# Patient Record
Sex: Male | Born: 1955 | Race: Black or African American | Hispanic: No | Marital: Married | Smoking: Former smoker
Health system: Southern US, Community
[De-identification: ages and names within clinical notes are randomized; demographics above are authoritative.]

## PROBLEM LIST (undated history)

## (undated) DIAGNOSIS — I10 Essential (primary) hypertension: Secondary | ICD-10-CM

## (undated) DIAGNOSIS — E7800 Pure hypercholesterolemia, unspecified: Secondary | ICD-10-CM

## (undated) DIAGNOSIS — K219 Gastro-esophageal reflux disease without esophagitis: Secondary | ICD-10-CM

## (undated) DIAGNOSIS — H269 Unspecified cataract: Secondary | ICD-10-CM

## (undated) DIAGNOSIS — M722 Plantar fascial fibromatosis: Secondary | ICD-10-CM

## (undated) DIAGNOSIS — M1990 Unspecified osteoarthritis, unspecified site: Secondary | ICD-10-CM

## (undated) DIAGNOSIS — R0989 Other specified symptoms and signs involving the circulatory and respiratory systems: Secondary | ICD-10-CM

## (undated) DIAGNOSIS — D649 Anemia, unspecified: Secondary | ICD-10-CM

## (undated) HISTORY — PX: OTHER SURGICAL HISTORY: SHX169

---

## 2007-08-02 ENCOUNTER — Encounter: Admission: RE | Admit: 2007-08-02 | Discharge: 2007-08-14 | Payer: Self-pay | Admitting: Orthopedic Surgery

## 2007-08-15 HISTORY — PX: OTHER SURGICAL HISTORY: SHX169

## 2007-08-19 ENCOUNTER — Encounter: Admission: RE | Admit: 2007-08-19 | Discharge: 2007-11-04 | Payer: Self-pay | Admitting: Orthopedic Surgery

## 2007-09-09 ENCOUNTER — Encounter: Admission: RE | Admit: 2007-09-09 | Discharge: 2007-11-04 | Payer: Self-pay | Admitting: Orthopedic Surgery

## 2007-11-07 ENCOUNTER — Encounter: Admission: RE | Admit: 2007-11-07 | Discharge: 2007-11-07 | Payer: Self-pay | Admitting: Orthopedic Surgery

## 2007-11-11 ENCOUNTER — Encounter: Admission: RE | Admit: 2007-11-11 | Discharge: 2007-11-11 | Payer: Self-pay | Admitting: Orthopedic Surgery

## 2008-01-02 ENCOUNTER — Ambulatory Visit (HOSPITAL_COMMUNITY): Admission: RE | Admit: 2008-01-02 | Discharge: 2008-01-04 | Payer: Self-pay | Admitting: Orthopedic Surgery

## 2008-02-10 ENCOUNTER — Encounter: Admission: RE | Admit: 2008-02-10 | Discharge: 2008-05-10 | Payer: Self-pay | Admitting: Orthopedic Surgery

## 2009-08-26 ENCOUNTER — Ambulatory Visit (HOSPITAL_COMMUNITY): Admission: RE | Admit: 2009-08-26 | Discharge: 2009-08-26 | Payer: Self-pay | Admitting: Ophthalmology

## 2010-10-30 LAB — CBC
HCT: 43.1 % (ref 39.0–52.0)
Platelets: 277 10*3/uL (ref 150–400)
RDW: 15.1 % (ref 11.5–15.5)

## 2010-12-27 NOTE — Op Note (Signed)
NAME:  Micheal Lucas, Micheal Lucas NO.:  [id number]   MEDICAL RECORD NO.:  [id number]          PATIENT TYPE:  OIB   LOCATION:  5028                         FACILITY:  [facility]   PHYSICIAN:  [name], [name] DATE OF BIRTH:  [birth year]   DATE OF PROCEDURE:  01/02/2008  DATE OF DISCHARGE:                               OPERATIVE REPORT   PREOPERATIVE DIAGNOSES:  Left the genu varum, status post Carticel  cartilage transplantation.   POSTOPERATIVE DIAGNOSES:  Left the genu varum, status post Carticel  cartilage transplantation.   PROCEDURE PERFORMED:  High tibial osteotomy using Arthrex opening wedge  system.   ATTENDING SURGEON:  [name], MD   ASSISTANT:  [name], MD.   ANESTHESIA:  General anesthesia was used plus femoral block.   ESTIMATED BLOOD LOSS:  Minimal.   FLUID REPLACEMENT:  2000 mL crystalloid.   TOURNIQUET TIME:  1 hour 45 minutes at 300 mmHg.   INSTRUMENT COUNTS:  Correct.   COMPLICATIONS:  There were no complications.   ANTIBIOTICS:  Perioperative antibiotics were given.   INDICATIONS:  The patient is a 55 year old male with a history of a  cartilage injury to his left knee that he sustained on the job.  The  patient underwent multiple knee surgeries, the latest, which was a  autologous chondrocyte implantation or Carticel procedure.  The patient  has a significant varus alignment to the knee, which will over time lead  over stress of that cartilage transplantation.  After counseling the  patient regarding the benefits of high tibial osteotomy and unloading  the weight off the medial compartment, the patient elected to proceed  with surgical management.  Informed consent was obtained.   DESCRIPTION OF PROCEDURE:  After an adequate level of anesthesia was  achieved the patient was positioned supine on the operating table.  Nonsterile tourniquets placed on left proximal thigh.  Left leg  sterilely prepped and draped usual manner.   A hockey-stick type incision  was created starting at the medial tibial crest, below the tibial  tubercle, extending up to the tibial tubercle and proceeding medially.  Dissection was carried down through subcutaneous tissues using Bovie  electrocautery.  A full-thickness flap was lifted off the tibia down to  subperiosteal level.  At this point, a guide pin was placed about a  centimeter and half below the joint line, mid tibia, and perpendicular  little long axis of the tibia.  A guide was then inserted over the top  of the guide pin and two parallel K-wires were placed in oblique manner  angling to the appropriate point on the lateral side of the tibia.  This  was about a centimeter medial to the lateral cortex of the tibia.  Once  these pins were confirmed in their location on C-arm in multiple planes  including matching the posterior tibial slope the patient already had,  we went ahead and removed the proximal guide pin and then placed a saw  capture guide over the K-wires and then tapped a set pin in place to  hold that saw capture in place, so we then made her  osteotomy.  We had  dissected posteriorly and anteriorly and used appropriate retractors to  protect the soft tissue.  We then made her osteotomy with the  oscillating saw and then finished it with the osteotome.  We were  careful not to breech the lateral cortex.  We then opened the tibia.  Using the fluoroscope marking the center of the hip and the center of  the ankle, we corrected the mechanical axis to the midportion of the  lateral compartment of the knee.  Once we did this, we measured the size  of opening.  This was a 15-degree opening wedge and we selected the  appropriate opening wedge plate and applied that to the tibia.  At this  point, we went ahead and bone grafted using allograft portions of bone  that had been precut by the Arthrex system.  These have been soaking on  the back table and saline for about an  hour.  We fashioned these to the  appropriate size, inserted them into the appropriate defects, fully  filling the defect in the tibia, the plate was secured with 6.5  cancellous screws proximally and 4.5 bicortical screws distally.  Final  radiographs demonstrated appropriate correction of the patient's varus  deformity.  We thoroughly irrigated and closed with layered closure with  zero 2-0 Vicryl followed by staples for skin.  Sterile compressive  dressing and knee immobilizer were applied.  The patient tolerated the  surgery well.      [name], [name]  Electronically Signed     SRN/MEDQ  D:  01/03/2008  T:  01/03/2008  Job:  [id number]

## 2013-08-14 HISTORY — PX: EYE SURGERY: SHX253

## 2016-04-10 HISTORY — PX: EYE SURGERY: SHX253

## 2016-04-18 NOTE — Progress Notes (Signed)
Patient is being scheduled for preop appt. Please place surgical orders in epic. Thanks. 

## 2016-05-09 ENCOUNTER — Encounter (HOSPITAL_COMMUNITY)
Admission: RE | Admit: 2016-05-09 | Discharge: 2016-05-09 | Disposition: A | Discharge status: Home / Self Care | Payer: BLUE CROSS/BLUE SHIELD | Source: Ambulatory Visit | Attending: Orthopedic Surgery | Admitting: Orthopedic Surgery

## 2016-05-09 ENCOUNTER — Encounter (HOSPITAL_COMMUNITY): Payer: Self-pay

## 2016-05-09 DIAGNOSIS — Z01818 Encounter for other preprocedural examination: Secondary | ICD-10-CM | POA: Insufficient documentation

## 2016-05-09 HISTORY — DX: Gastro-esophageal reflux disease without esophagitis: K21.9

## 2016-05-09 HISTORY — DX: Essential (primary) hypertension: I10

## 2016-05-09 HISTORY — DX: Anemia, unspecified: D64.9

## 2016-05-09 HISTORY — DX: Pure hypercholesterolemia, unspecified: E78.00

## 2016-05-09 HISTORY — DX: Unspecified osteoarthritis, unspecified site: M19.90

## 2016-05-09 HISTORY — DX: Other specified symptoms and signs involving the circulatory and respiratory systems: R09.89

## 2016-05-09 HISTORY — DX: Plantar fascial fibromatosis: M72.2

## 2016-05-09 HISTORY — DX: Unspecified cataract: H26.9

## 2016-05-09 LAB — CBC
HCT: 40 % (ref 39.0–52.0)
HEMOGLOBIN: 12.9 g/dL — AB (ref 13.0–17.0)
MCH: 22.2 pg — AB (ref 26.0–34.0)
MCHC: 32.3 g/dL (ref 30.0–36.0)
MCV: 68.7 fL — ABNORMAL LOW (ref 78.0–100.0)
PLATELETS: 218 10*3/uL (ref 150–400)
RBC: 5.82 MIL/uL — ABNORMAL HIGH (ref 4.22–5.81)
RDW: 15.7 % — ABNORMAL HIGH (ref 11.5–15.5)
WBC: 8.3 10*3/uL (ref 4.0–10.5)

## 2016-05-09 LAB — BASIC METABOLIC PANEL
Anion gap: 5 (ref 5–15)
BUN: 10 mg/dL (ref 6–20)
CHLORIDE: 105 mmol/L (ref 101–111)
CO2: 29 mmol/L (ref 22–32)
CREATININE: 0.94 mg/dL (ref 0.61–1.24)
Calcium: 9.3 mg/dL (ref 8.9–10.3)
GFR calc Af Amer: >60 mL/min (ref >60)
GFR calc non Af Amer: >60 mL/min (ref >60)
Glucose, Bld: 88 mg/dL (ref 65–99)
Potassium: 4.3 mmol/L (ref 3.5–5.1)
SODIUM: 139 mmol/L (ref 135–145)

## 2016-05-09 LAB — SURGICAL PCR SCREEN
MRSA, PCR: NEGATIVE
Staphylococcus aureus: NEGATIVE

## 2016-05-09 NOTE — Patient Instructions (Signed)
Micheal PerchWilliam Mcwhirt  05/09/2016   Your procedure is scheduled on: 05-23-16  [facility]  [facility] 3[facility] floor to  Short Stay Center at 515  AM.  Call this number if you have problems the morning of surgery [phone]   Remember: ONLY 1 PERSON MAY GO WITH YOU TO SHORT STAY TO GET  READY MORNING OF YOUR SURGERY.  Do not eat food or drink liquids :After Midnight.     Take these medicines the morning of surgery with A SIP OF WATER: CARVEDILOL, EYEDROP, OMEPRAZOLE                               You may not have any metal on your body including hair pins and              piercings  Do not wear jewelry, make-up, lotions, powders or perfumes, deodorant             Do not wear nail polish.  Do not shave  48 hours prior to surgery.              Men may shave face and neck.   Do not bring valuables to the hospital. [facility] IS NOT             RESPONSIBLE   FOR VALUABLES.  Contacts, dentures or bridgework may not be worn into surgery.  Leave suitcase in the car. After surgery it may be brought to your room.                  Please read over the following fact sheets you were given: _____________________________________________________________________             [facility] - Preparing for Surgery Before surgery, you can play an important role.  Because skin is not sterile, your skin needs to be as free of germs as possible.  You can reduce the number of germs on your skin by washing with CHG (chlorahexidine gluconate) soap before surgery.  CHG is an antiseptic cleaner which kills germs and bonds with the skin to continue killing germs even after washing. Please DO NOT use if you have an allergy to CHG or antibacterial soaps.  If your skin becomes reddened/irritated stop using the CHG and inform your nurse when you arrive at Short Stay. Do not shave (including legs and underarms) for at least 48 hours prior to the first CHG  shower.  You may shave your face/neck. Please follow these instructions carefully:  1.  Shower with CHG Soap the night before surgery and the  morning of Surgery.  2.  If you choose to wash your hair, wash your hair first as usual with your  normal  shampoo.  3.  After you shampoo, rinse your hair and body thoroughly to remove the  shampoo.                           4.  Use CHG as you would any other liquid soap.  You can apply chg directly  to the skin and wash                       Gently with a scrungie or clean washcloth.  5.  Apply the CHG  Soap to your body ONLY FROM THE NECK DOWN.   Do not use on face/ open                           Wound or open sores. Avoid contact with eyes, ears mouth and genitals (private parts).                       Wash face,  Genitals (private parts) with your normal soap.             6.  Wash thoroughly, paying special attention to the area where your surgery  will be performed.  7.  Thoroughly rinse your body with warm water from the neck down.  8.  DO NOT shower/wash with your normal soap after using and rinsing off  the CHG Soap.                9.  Pat yourself dry with a clean towel.            10.  Wear clean pajamas.            11.  Place clean sheets on your bed the night of your first shower and do not  sleep with pets. Day of Surgery : Do not apply any lotions/deodorants the morning of surgery.  Please wear clean clothes to the hospital/surgery center.  FAILURE TO FOLLOW THESE INSTRUCTIONS MAY RESULT IN THE CANCELLATION OF YOUR SURGERY PATIENT SIGNATURE_________________________________  NURSE SIGNATURE__________________________________  ________________________________________________________________________   Rogelia MireIncentive Spirometer  An incentive spirometer is a tool that can help keep your lungs clear and active. This tool measures how well you are filling your lungs with each breath. Taking long deep breaths may help reverse or decrease the chance  of developing breathing (pulmonary) problems (especially infection) following:  A long period of time when you are unable to move or be active. BEFORE THE PROCEDURE   If the spirometer includes an indicator to show your best effort, your nurse or respiratory therapist will set it to a desired goal.  If possible, sit up straight or lean slightly forward. Try not to slouch.  Hold the incentive spirometer in an upright position. INSTRUCTIONS FOR USE  1. Sit on the edge of your bed if possible, or sit up as far as you can in bed or on a chair. 2. Hold the incentive spirometer in an upright position. 3. Breathe out normally. 4. Place the mouthpiece in your mouth and seal your lips tightly around it. 5. Breathe in slowly and as deeply as possible, raising the piston or the ball toward the top of the column. 6. Hold your breath for 3-5 seconds or for as long as possible. Allow the piston or ball to fall to the bottom of the column. 7. Remove the mouthpiece from your mouth and breathe out normally. 8. Rest for a few seconds and repeat Steps 1 through 7 at least 10 times every 1-2 hours when you are awake. Take your time and take a few normal breaths between deep breaths. 9. The spirometer may include an indicator to show your best effort. Use the indicator as a goal to work toward during each repetition. 10. After each set of 10 deep breaths, practice coughing to be sure your lungs are clear. If you have an incision (the cut made at the time of surgery), support your incision when coughing by placing a pillow or rolled up towels  firmly against it. Once you are able to get out of bed, walk around indoors and cough well. You may stop using the incentive spirometer when instructed by your caregiver.  RISKS AND COMPLICATIONS  Take your time so you do not get dizzy or light-headed.  If you are in pain, you may need to take or ask for pain medication before doing incentive spirometry. It is harder to  take a deep breath if you are having pain. AFTER USE  Rest and breathe slowly and easily.  It can be helpful to keep track of a log of your progress. Your caregiver can provide you with a simple table to help with this. If you are using the spirometer at home, follow these instructions: SEEK MEDICAL CARE IF:   You are having difficultly using the spirometer.  You have trouble using the spirometer as often as instructed.  Your pain medication is not giving enough relief while using the spirometer.  You develop fever of 100.5 F (38.1 C) or higher. SEEK IMMEDIATE MEDICAL CARE IF:   You cough up bloody sputum that had not been present before.  You develop fever of 102 F (38.9 C) or greater.  You develop worsening pain at or near the incision site. MAKE SURE YOU:   Understand these instructions.  Will watch your condition.  Will get help right away if you are not doing well or get worse. Document Released: 12/11/2006 Document Revised: 10/23/2011 Document Reviewed: 02/11/2007 Exit[employer] Patient Information 2014 ExitCare, [place].   ________________________________________________________________________  WHAT IS A BLOOD TRANSFUSION? Blood Transfusion Information  A transfusion is the replacement of blood or some of its parts. Blood is made up of multiple cells which provide different functions.  Red blood cells carry oxygen and are used for blood loss replacement.  White blood cells fight against infection.  Platelets control bleeding.  Plasma helps clot blood.  Other blood products are available for specialized needs, such as hemophilia or other clotting disorders. BEFORE THE TRANSFUSION  Who gives blood for transfusions?   Healthy volunteers who are fully evaluated to make sure their blood is safe. This is blood bank blood. Transfusion therapy is the safest it has ever been in the practice of medicine. Before blood is taken from a donor, a complete history is taken to  make sure that person has no history of diseases nor engages in risky social behavior (examples are intravenous drug use or sexual activity with multiple partners). The donor's travel history is screened to minimize risk of transmitting infections, such as malaria. The donated blood is tested for signs of infectious diseases, such as HIV and hepatitis. The blood is then tested to be sure it is compatible with you in order to minimize the chance of a transfusion reaction. If you or a relative donates blood, this is often done in anticipation of surgery and is not appropriate for emergency situations. It takes many days to process the donated blood. RISKS AND COMPLICATIONS Although transfusion therapy is very safe and saves many lives, the main dangers of transfusion include:   Getting an infectious disease.  Developing a transfusion reaction. This is an allergic reaction to something in the blood you were given. Every precaution is taken to prevent this. The decision to have a blood transfusion has been considered carefully by your caregiver before blood is given. Blood is not given unless the benefits outweigh the risks. AFTER THE TRANSFUSION  Right after receiving a blood transfusion, you will usually feel much better  and more energetic. This is especially true if your red blood cells have gotten low (anemic). The transfusion raises the level of the red blood cells which carry oxygen, and this usually causes an energy increase.  The nurse administering the transfusion will monitor you carefully for complications. HOME CARE INSTRUCTIONS  No special instructions are needed after a transfusion. You may find your energy is better. Speak with your caregiver about any limitations on activity for underlying diseases you may have. SEEK MEDICAL CARE IF:   Your condition is not improving after your transfusion.  You develop redness or irritation at the intravenous (IV) site. SEEK IMMEDIATE MEDICAL CARE  IF:  Any of the following symptoms occur over the next 12 hours:  Shaking chills.  You have a temperature by mouth above 102 F (38.9 C), not controlled by medicine.  Chest, back, or muscle pain.  People around you feel you are not acting correctly or are confused.  Shortness of breath or difficulty breathing.  Dizziness and fainting.  You get a rash or develop hives.  You have a decrease in urine output.  Your urine turns a dark color or changes to pink, red, or brown. Any of the following symptoms occur over the next 10 days:  You have a temperature by mouth above 102 F (38.9 C), not controlled by medicine.  Shortness of breath.  Weakness after normal activity.  The white part of the eye turns yellow (jaundice).  You have a decrease in the amount of urine or are urinating less often.  Your urine turns a dark color or changes to pink, red, or brown. Document Released: 07/28/2000 Document Revised: 10/23/2011 Document Reviewed: 03/16/2008 [facility] Patient Information 2014 [place], [place].  _______________________________________________________________________

## 2016-05-09 NOTE — Progress Notes (Signed)
EKG 01-05-16 [name] CARD ON CHART  CARDIO CLEARANCE DR CHEEK 04-20-16 ON CHART MEDICAL CLEARANCE DR Olegario MessierKATHY Lucas 05-01-16 ON CHART LOC CARDIO DR CHEEK 8-18 17 ON CHART

## 2016-05-19 NOTE — H&P (Signed)
TOTAL KNEE ADMISSION H&P  Patient is being admitted for right total knee arthroplasty.  Subjective:  Chief Complaint:    Right knee primary OA / pain  HPI: Micheal Lucas, 60 y.o. male, has a history of pain and functional disability in the right knee due to arthritis and has failed non-surgical conservative treatments for greater than 12 weeks to includeNSAID's and/or analgesics, corticosteriod injections and activity modification.  Onset of symptoms was gradual, starting 1+ years ago with gradually worsening course since that time. The patient noted no past surgery on the right knee(s).  Patient currently rates pain in the right knee(s) at 8 out of 10 with activity. Patient has night pain, worsening of pain with activity and weight bearing, pain that interferes with activities of daily living, pain with passive range of motion, crepitus and joint swelling.  Patient has evidence of periarticular osteophytes and joint space narrowing by imaging studies.  There is no active infection.  Risks, benefits and expectations were discussed with the patient.  Risks including but not limited to the risk of anesthesia, blood clots, nerve damage, blood vessel damage, failure of the prosthesis, infection and up to and including death.  Patient understand the risks, benefits and expectations and wishes to proceed with surgery.   PCP: Mattie Marlin, MD  D/C Plans:      Home  Post-op Meds:       No Rx given  Tranexamic Acid:      To be given - IV    Decadron:      Is to be given  FYI:     ASA  Norco  Toradol  Dilaudid (ok to use per pt)      Past Medical History:  Diagnosis Date  . Anemia   . Arthritis    oa  . Bruit of right carotid artery   . Cataract    right eye  . Elevated cholesterol   . GERD (gastroesophageal reflux disease)   . Hypertension   . Plantar fascial fibromatosis     Past Surgical History:  Procedure Laterality Date  . colonscopy  age 62  . EYE SURGERY Left 04/10/2016    cataract surgery  . knee scope and cartlidge implant  2009    No prescriptions prior to admission.   Not on File  Social History  Substance Use Topics  . Smoking status: Former Smoker    Quit date: 08/14/1997  . Smokeless tobacco: Current User  . Alcohol use Yes     Comment: occ wine       Review of Systems  Constitutional: Negative.   HENT: Negative.   Eyes: Negative.   Respiratory: Negative.   Cardiovascular: Negative.   Gastrointestinal: Positive for heartburn.  Genitourinary: Negative.   Musculoskeletal: Positive for joint pain.  Skin: Negative.   Neurological: Negative.   Endo/Heme/Allergies: Negative.   Psychiatric/Behavioral: Negative.     Objective:  Physical Exam  Constitutional: He is oriented to person, place, and time. He appears well-developed.  HENT:  Head: Normocephalic.  Eyes: Pupils are equal, round, and reactive to light.  Neck: Neck supple. No JVD present. No tracheal deviation present. No thyromegaly present.  Cardiovascular: Normal rate, regular rhythm and intact distal pulses.   Respiratory: Effort normal and breath sounds normal. No respiratory distress. He has no wheezes.  GI: Soft. There is no tenderness. There is no guarding.  Musculoskeletal:       Right knee: He exhibits decreased range of motion, swelling and bony tenderness. He exhibits  no ecchymosis, no deformity, no laceration and no erythema. Tenderness found.  Lymphadenopathy:    He has no cervical adenopathy.  Neurological: He is alert and oriented to person, place, and time.  Skin: Skin is warm and dry.  Psychiatric: He has a normal mood and affect.      Labs:  Estimated body mass index is 30.48 kg/m as calculated from the following:   Height as of 05/09/16: 6\' 1"  (1.854 m).   Weight as of 05/09/16: 104.8 kg (231 lb).   Imaging Review Plain radiographs demonstrate severe degenerative joint disease of the right knee(s). The bone quality appears to be good for age and  reported activity level.  Assessment/Plan:  End stage arthritis, right knee   The patient history, physical examination, clinical judgment of the provider and imaging studies are consistent with end stage degenerative joint disease of the right knee(s) and total knee arthroplasty is deemed medically necessary. The treatment options including medical management, injection therapy arthroscopy and arthroplasty were discussed at length. The risks and benefits of total knee arthroplasty were presented and reviewed. The risks due to aseptic loosening, infection, stiffness, patella tracking problems, thromboembolic complications and other imponderables were discussed. The patient acknowledged the explanation, agreed to proceed with the plan and consent was signed. Patient is being admitted for inpatient treatment for surgery, pain control, PT, OT, prophylactic antibiotics, VTE prophylaxis, progressive ambulation and ADL's and discharge planning. The patient is planning to be discharged home.      Anastasio AuerbachMatthew S. Ander Wamser   PA-C  05/19/2016, 5:18 PM

## 2016-05-22 NOTE — Anesthesia Preprocedure Evaluation (Signed)
Anesthesia Evaluation  Patient identified by MRN, date of birth, ID band Patient awake    Reviewed: Allergy & Precautions, NPO status , Patient's Chart, lab work & pertinent test results  Airway Mallampati: II  TM Distance: >3 FB Neck ROM: Full    Dental no notable dental hx.    Pulmonary neg pulmonary ROS, former smoker,    Pulmonary exam normal breath sounds clear to auscultation       Cardiovascular hypertension, Pt. on medications Normal cardiovascular exam Rhythm:Regular Rate:Normal     Neuro/Psych negative neurological ROS  negative psych ROS   GI/Hepatic Neg liver ROS, GERD  ,  Endo/Other  negative endocrine ROS  Renal/GU negative Renal ROS  negative genitourinary   Musculoskeletal  (+) Arthritis ,   Abdominal   Peds  Hematology negative hematology ROS (+) anemia ,   Anesthesia Other Findings   Reproductive/Obstetrics negative OB ROS                             Anesthesia Physical Anesthesia Plan  ASA: II  Anesthesia Plan: Spinal and Regional   Post-op Pain Management:    Induction:   Airway Management Planned:   Additional Equipment:   Intra-op Plan:   Post-operative Plan:   Informed Consent: I have reviewed the patients History and Physical, chart, labs and discussed the procedure including the risks, benefits and alternatives for the proposed anesthesia with the patient or authorized representative who has indicated his/her understanding and acceptance.   Dental advisory given  Plan Discussed with: CRNA  Anesthesia Plan Comments:         Anesthesia Quick Evaluation

## 2016-05-23 ENCOUNTER — Encounter (HOSPITAL_COMMUNITY): Payer: Self-pay

## 2016-05-23 ENCOUNTER — Inpatient Hospital Stay (HOSPITAL_COMMUNITY): Payer: BLUE CROSS/BLUE SHIELD | Admitting: Anesthesiology

## 2016-05-23 ENCOUNTER — Inpatient Hospital Stay (HOSPITAL_COMMUNITY)
Admission: RE | Admit: 2016-05-23 | Discharge: 2016-05-24 | DRG: 470 | Disposition: A | Discharge status: Home Health | Payer: BLUE CROSS/BLUE SHIELD | Source: Ambulatory Visit | Attending: Orthopedic Surgery | Admitting: Orthopedic Surgery

## 2016-05-23 ENCOUNTER — Encounter (HOSPITAL_COMMUNITY)
Admission: RE | Disposition: A | Discharge status: Home Health | Payer: Self-pay | Source: Ambulatory Visit | Attending: Orthopedic Surgery

## 2016-05-23 DIAGNOSIS — K219 Gastro-esophageal reflux disease without esophagitis: Secondary | ICD-10-CM | POA: Diagnosis present

## 2016-05-23 DIAGNOSIS — M722 Plantar fascial fibromatosis: Secondary | ICD-10-CM | POA: Diagnosis present

## 2016-05-23 DIAGNOSIS — F17290 Nicotine dependence, other tobacco product, uncomplicated: Secondary | ICD-10-CM | POA: Diagnosis not present

## 2016-05-23 DIAGNOSIS — Z6830 Body mass index (BMI) 30.0-30.9, adult: Secondary | ICD-10-CM

## 2016-05-23 DIAGNOSIS — M25761 Osteophyte, right knee: Secondary | ICD-10-CM | POA: Diagnosis present

## 2016-05-23 DIAGNOSIS — M659 Synovitis and tenosynovitis, unspecified: Secondary | ICD-10-CM | POA: Diagnosis present

## 2016-05-23 DIAGNOSIS — Z7982 Long term (current) use of aspirin: Secondary | ICD-10-CM

## 2016-05-23 DIAGNOSIS — D649 Anemia, unspecified: Secondary | ICD-10-CM | POA: Diagnosis present

## 2016-05-23 DIAGNOSIS — M1711 Unilateral primary osteoarthritis, right knee: Principal | ICD-10-CM | POA: Diagnosis present

## 2016-05-23 DIAGNOSIS — H269 Unspecified cataract: Secondary | ICD-10-CM | POA: Diagnosis present

## 2016-05-23 DIAGNOSIS — E7800 Pure hypercholesterolemia, unspecified: Secondary | ICD-10-CM | POA: Diagnosis present

## 2016-05-23 DIAGNOSIS — Z79899 Other long term (current) drug therapy: Secondary | ICD-10-CM

## 2016-05-23 DIAGNOSIS — E669 Obesity, unspecified: Secondary | ICD-10-CM | POA: Diagnosis present

## 2016-05-23 DIAGNOSIS — I10 Essential (primary) hypertension: Secondary | ICD-10-CM | POA: Diagnosis present

## 2016-05-23 DIAGNOSIS — Z96659 Presence of unspecified artificial knee joint: Secondary | ICD-10-CM

## 2016-05-23 DIAGNOSIS — R0989 Other specified symptoms and signs involving the circulatory and respiratory systems: Secondary | ICD-10-CM | POA: Diagnosis present

## 2016-05-23 DIAGNOSIS — Z96651 Presence of right artificial knee joint: Secondary | ICD-10-CM

## 2016-05-23 HISTORY — PX: TOTAL KNEE ARTHROPLASTY: SHX125

## 2016-05-23 LAB — TYPE AND SCREEN
ABO/RH(D): O POS
Antibody Screen: NEGATIVE

## 2016-05-23 LAB — ABO/RH: ABO/RH(D): O POS

## 2016-05-23 SURGERY — ARTHROPLASTY, KNEE, TOTAL
Anesthesia: Regional | Site: Knee | Laterality: Right

## 2016-05-23 MED ORDER — PROPOFOL 10 MG/ML IV BOLUS
INTRAVENOUS | Status: AC
  Filled 2016-05-23: qty 60

## 2016-05-23 MED ORDER — PRAVASTATIN SODIUM 20 MG PO TABS
40.0000 mg | ORAL_TABLET | Freq: Every evening | ORAL | Status: DC
  Administered 2016-05-23: 40 mg via ORAL
  Filled 2016-05-23: qty 2

## 2016-05-23 MED ORDER — FENTANYL CITRATE (PF) 100 MCG/2ML IJ SOLN
INTRAMUSCULAR | Status: DC | PRN
  Administered 2016-05-23: 50 ug via INTRAVENOUS

## 2016-05-23 MED ORDER — PHENOL 1.4 % MT LIQD
1.0000 | OROMUCOSAL | Status: DC | PRN
  Filled 2016-05-23: qty 177

## 2016-05-23 MED ORDER — STERILE WATER FOR IRRIGATION IR SOLN
Status: DC | PRN
  Administered 2016-05-23: 2000 mL

## 2016-05-23 MED ORDER — ONDANSETRON HCL 4 MG/2ML IJ SOLN
INTRAMUSCULAR | Status: DC | PRN
  Administered 2016-05-23: 4 mg via INTRAVENOUS

## 2016-05-23 MED ORDER — 0.9 % SODIUM CHLORIDE (POUR BTL) OPTIME
TOPICAL | Status: DC | PRN
  Administered 2016-05-23: 1000 mL

## 2016-05-23 MED ORDER — HYDROMORPHONE HCL 1 MG/ML IJ SOLN: 0.2500 mg | INTRAMUSCULAR | Status: DC | PRN

## 2016-05-23 MED ORDER — BUPIVACAINE IN DEXTROSE 0.75-8.25 % IT SOLN
INTRATHECAL | Status: DC | PRN
  Administered 2016-05-23: 2 mL via INTRATHECAL

## 2016-05-23 MED ORDER — DORZOLAMIDE HCL-TIMOLOL MAL 2-0.5 % OP SOLN
1.0000 [drp] | Freq: Two times a day (BID) | OPHTHALMIC | Status: DC
  Administered 2016-05-23 – 2016-05-24 (×2): 1 [drp] via OPHTHALMIC
  Filled 2016-05-23: qty 10

## 2016-05-23 MED ORDER — SODIUM CHLORIDE 0.9 % IJ SOLN
INTRAMUSCULAR | Status: DC | PRN
  Administered 2016-05-23: 29 mL

## 2016-05-23 MED ORDER — ONDANSETRON HCL 4 MG PO TABS: 4.0000 mg | ORAL_TABLET | Freq: Four times a day (QID) | ORAL | Status: DC | PRN

## 2016-05-23 MED ORDER — KETOROLAC TROMETHAMINE 30 MG/ML IJ SOLN
INTRAMUSCULAR | Status: AC
  Filled 2016-05-23: qty 1

## 2016-05-23 MED ORDER — CEFAZOLIN SODIUM-DEXTROSE 2-4 GM/100ML-% IV SOLN
INTRAVENOUS | Status: AC
  Filled 2016-05-23: qty 100

## 2016-05-23 MED ORDER — DEXAMETHASONE SODIUM PHOSPHATE 10 MG/ML IJ SOLN
10.0000 mg | Freq: Once | INTRAMUSCULAR | Status: AC
  Administered 2016-05-23: 10 mg via INTRAVENOUS

## 2016-05-23 MED ORDER — MENTHOL 3 MG MT LOZG: 1.0000 | LOZENGE | OROMUCOSAL | Status: DC | PRN

## 2016-05-23 MED ORDER — TRANEXAMIC ACID 1000 MG/10ML IV SOLN
1000.0000 mg | INTRAVENOUS | Status: AC
  Administered 2016-05-23: 1000 mg via INTRAVENOUS
  Filled 2016-05-23: qty 1100

## 2016-05-23 MED ORDER — SODIUM CHLORIDE 0.9 % IJ SOLN
INTRAMUSCULAR | Status: AC
  Filled 2016-05-23: qty 50

## 2016-05-23 MED ORDER — METOCLOPRAMIDE HCL 5 MG PO TABS: 5.0000 mg | ORAL_TABLET | Freq: Three times a day (TID) | ORAL | Status: DC | PRN

## 2016-05-23 MED ORDER — LACTATED RINGERS IV SOLN
INTRAVENOUS | Status: DC | PRN
  Administered 2016-05-23 (×2): via INTRAVENOUS

## 2016-05-23 MED ORDER — MAGNESIUM CITRATE PO SOLN: 1.0000 | Freq: Once | ORAL | Status: DC | PRN

## 2016-05-23 MED ORDER — POLYETHYLENE GLYCOL 3350 17 G PO PACK
17.0000 g | PACK | Freq: Two times a day (BID) | ORAL | Status: DC
  Administered 2016-05-23 – 2016-05-24 (×2): 17 g via ORAL
  Filled 2016-05-23 (×2): qty 1

## 2016-05-23 MED ORDER — KETOROLAC TROMETHAMINE 30 MG/ML IJ SOLN
INTRAMUSCULAR | Status: DC | PRN
  Administered 2016-05-23: 30 mg

## 2016-05-23 MED ORDER — PROPOFOL 10 MG/ML IV BOLUS
INTRAVENOUS | Status: AC
  Filled 2016-05-23: qty 20

## 2016-05-23 MED ORDER — MIDAZOLAM HCL 2 MG/2ML IJ SOLN
INTRAMUSCULAR | Status: AC
  Filled 2016-05-23: qty 2

## 2016-05-23 MED ORDER — BISACODYL 10 MG RE SUPP: 10.0000 mg | Freq: Every day | RECTAL | Status: DC | PRN

## 2016-05-23 MED ORDER — METOCLOPRAMIDE HCL 5 MG/ML IJ SOLN: 5.0000 mg | Freq: Three times a day (TID) | INTRAMUSCULAR | Status: DC | PRN

## 2016-05-23 MED ORDER — MIDAZOLAM HCL 5 MG/5ML IJ SOLN
INTRAMUSCULAR | Status: DC | PRN
  Administered 2016-05-23: 2 mg via INTRAVENOUS

## 2016-05-23 MED ORDER — HYDROCODONE-ACETAMINOPHEN 7.5-325 MG PO TABS
1.0000 | ORAL_TABLET | ORAL | Status: DC
  Administered 2016-05-23: 1 via ORAL
  Administered 2016-05-23 (×3): 2 via ORAL
  Administered 2016-05-23: 1 via ORAL
  Administered 2016-05-24 (×4): 2 via ORAL
  Filled 2016-05-23: qty 1
  Filled 2016-05-23 (×3): qty 2
  Filled 2016-05-23: qty 1
  Filled 2016-05-23 (×4): qty 2

## 2016-05-23 MED ORDER — NON FORMULARY: 20.0000 mg | Freq: Every day | Status: DC

## 2016-05-23 MED ORDER — CELECOXIB 200 MG PO CAPS
200.0000 mg | ORAL_CAPSULE | Freq: Two times a day (BID) | ORAL | Status: DC
  Administered 2016-05-23 – 2016-05-24 (×2): 200 mg via ORAL
  Filled 2016-05-23 (×2): qty 1

## 2016-05-23 MED ORDER — ROPIVACAINE HCL 7.5 MG/ML IJ SOLN
INTRAMUSCULAR | Status: AC
  Filled 2016-05-23: qty 20

## 2016-05-23 MED ORDER — ROPIVACAINE HCL 7.5 MG/ML IJ SOLN
INTRAMUSCULAR | Status: DC | PRN
  Administered 2016-05-23: 20 mL via PERINEURAL

## 2016-05-23 MED ORDER — DEXAMETHASONE SODIUM PHOSPHATE 10 MG/ML IJ SOLN
INTRAMUSCULAR | Status: AC
  Filled 2016-05-23: qty 1

## 2016-05-23 MED ORDER — PROPOFOL 10 MG/ML IV BOLUS
INTRAVENOUS | Status: DC | PRN
  Administered 2016-05-23: 30 mg via INTRAVENOUS
  Administered 2016-05-23: 20 mg via INTRAVENOUS
  Administered 2016-05-23: 30 mg via INTRAVENOUS

## 2016-05-23 MED ORDER — PROPOFOL 500 MG/50ML IV EMUL
INTRAVENOUS | Status: DC | PRN
  Administered 2016-05-23: 75 ug/kg/min via INTRAVENOUS

## 2016-05-23 MED ORDER — CEFAZOLIN SODIUM-DEXTROSE 2-4 GM/100ML-% IV SOLN
2.0000 g | INTRAVENOUS | Status: AC
  Administered 2016-05-23: 2 g via INTRAVENOUS

## 2016-05-23 MED ORDER — SODIUM CHLORIDE 0.9 % IR SOLN
Status: DC | PRN
Start: 2016-05-23 — End: 2016-05-23
  Administered 2016-05-23: 1000 mL

## 2016-05-23 MED ORDER — BUPIVACAINE HCL (PF) 0.25 % IJ SOLN
INTRAMUSCULAR | Status: AC
  Filled 2016-05-23: qty 30

## 2016-05-23 MED ORDER — FENTANYL CITRATE (PF) 100 MCG/2ML IJ SOLN
INTRAMUSCULAR | Status: AC
  Filled 2016-05-23: qty 2

## 2016-05-23 MED ORDER — ONDANSETRON HCL 4 MG/2ML IJ SOLN
INTRAMUSCULAR | Status: AC
  Filled 2016-05-23: qty 2

## 2016-05-23 MED ORDER — FERROUS SULFATE 325 (65 FE) MG PO TABS
325.0000 mg | ORAL_TABLET | Freq: Three times a day (TID) | ORAL | Status: DC
  Administered 2016-05-24 (×2): 325 mg via ORAL
  Filled 2016-05-23 (×2): qty 1

## 2016-05-23 MED ORDER — BUPIVACAINE-EPINEPHRINE (PF) 0.25% -1:200000 IJ SOLN: INTRAMUSCULAR | Status: DC | PRN

## 2016-05-23 MED ORDER — HYDROMORPHONE HCL 1 MG/ML IJ SOLN
0.5000 mg | INTRAMUSCULAR | Status: DC | PRN
  Administered 2016-05-23 (×2): 1 mg via INTRAVENOUS
  Filled 2016-05-23 (×2): qty 1

## 2016-05-23 MED ORDER — OMEPRAZOLE 20 MG PO CPDR
20.0000 mg | DELAYED_RELEASE_CAPSULE | Freq: Every day | ORAL | Status: DC
  Administered 2016-05-24: 20 mg via ORAL
  Filled 2016-05-23: qty 1

## 2016-05-23 MED ORDER — PROMETHAZINE HCL 25 MG/ML IJ SOLN: 6.2500 mg | INTRAMUSCULAR | Status: DC | PRN

## 2016-05-23 MED ORDER — ASPIRIN 81 MG PO CHEW
81.0000 mg | CHEWABLE_TABLET | Freq: Two times a day (BID) | ORAL | Status: DC
  Administered 2016-05-23 – 2016-05-24 (×2): 81 mg via ORAL
  Filled 2016-05-23 (×2): qty 1

## 2016-05-23 MED ORDER — MEPERIDINE HCL 50 MG/ML IJ SOLN: 6.2500 mg | INTRAMUSCULAR | Status: DC | PRN

## 2016-05-23 MED ORDER — LACTATED RINGERS IV SOLN: INTRAVENOUS | Status: DC

## 2016-05-23 MED ORDER — METHOCARBAMOL 1000 MG/10ML IJ SOLN
500.0000 mg | Freq: Four times a day (QID) | INTRAVENOUS | Status: DC | PRN
  Administered 2016-05-23: 500 mg via INTRAVENOUS
  Filled 2016-05-23: qty 5
  Filled 2016-05-23: qty 550

## 2016-05-23 MED ORDER — CEFAZOLIN SODIUM-DEXTROSE 2-4 GM/100ML-% IV SOLN
2.0000 g | Freq: Four times a day (QID) | INTRAVENOUS | Status: AC
  Administered 2016-05-23 (×2): 2 g via INTRAVENOUS
  Filled 2016-05-23 (×2): qty 100

## 2016-05-23 MED ORDER — DEXAMETHASONE SODIUM PHOSPHATE 10 MG/ML IJ SOLN
10.0000 mg | Freq: Once | INTRAMUSCULAR | Status: AC
  Administered 2016-05-24: 10 mg via INTRAVENOUS
  Filled 2016-05-23: qty 1

## 2016-05-23 MED ORDER — DOCUSATE SODIUM 100 MG PO CAPS
100.0000 mg | ORAL_CAPSULE | Freq: Two times a day (BID) | ORAL | Status: DC
  Administered 2016-05-23 – 2016-05-24 (×2): 100 mg via ORAL
  Filled 2016-05-23 (×2): qty 1

## 2016-05-23 MED ORDER — SODIUM CHLORIDE 0.9 % IV SOLN
INTRAVENOUS | Status: DC
  Administered 2016-05-23: 23:00:00 via INTRAVENOUS
  Administered 2016-05-23: 100 mL/h via INTRAVENOUS

## 2016-05-23 MED ORDER — CARVEDILOL 3.125 MG PO TABS
3.1250 mg | ORAL_TABLET | Freq: Two times a day (BID) | ORAL | Status: DC
  Administered 2016-05-24: 3.125 mg via ORAL
  Filled 2016-05-23: qty 1

## 2016-05-23 MED ORDER — BUPIVACAINE HCL (PF) 0.25 % IJ SOLN
INTRAMUSCULAR | Status: DC | PRN
Start: 2016-05-23 — End: 2016-05-23
  Administered 2016-05-23: 30 mL

## 2016-05-23 MED ORDER — ALUM & MAG HYDROXIDE-SIMETH 200-200-20 MG/5ML PO SUSP: 30.0000 mL | ORAL | Status: DC | PRN

## 2016-05-23 MED ORDER — METHOCARBAMOL 500 MG PO TABS
500.0000 mg | ORAL_TABLET | Freq: Four times a day (QID) | ORAL | Status: DC | PRN
  Administered 2016-05-23 – 2016-05-24 (×3): 500 mg via ORAL
  Filled 2016-05-23 (×3): qty 1

## 2016-05-23 MED ORDER — ONDANSETRON HCL 4 MG/2ML IJ SOLN: 4.0000 mg | Freq: Four times a day (QID) | INTRAMUSCULAR | Status: DC | PRN

## 2016-05-23 MED ORDER — DIPHENHYDRAMINE HCL 25 MG PO CAPS: 25.0000 mg | ORAL_CAPSULE | Freq: Four times a day (QID) | ORAL | Status: DC | PRN

## 2016-05-23 SURGICAL SUPPLY — 45 items
BAG ZIPLOCK 12X15 (MISCELLANEOUS) ×3 IMPLANT
BANDAGE ACE 6X5 VEL STRL LF (GAUZE/BANDAGES/DRESSINGS) ×3 IMPLANT
BLADE SAW SGTL 13.0X1.19X90.0M (BLADE) ×3 IMPLANT
BOWL SMART MIX CTS (DISPOSABLE) ×3 IMPLANT
CAPT KNEE TOTAL 3 ATTUNE ×3 IMPLANT
CEMENT HV SMART SET (Cement) ×6 IMPLANT
CLOTH BEACON ORANGE TIMEOUT ST (SAFETY) ×3 IMPLANT
CUFF TOURN SGL QUICK 34 (TOURNIQUET CUFF) ×2
CUFF TRNQT CYL 34X4X40X1 (TOURNIQUET CUFF) ×1 IMPLANT
DERMABOND ADVANCED (GAUZE/BANDAGES/DRESSINGS) ×2
DERMABOND ADVANCED .7 DNX12 (GAUZE/BANDAGES/DRESSINGS) ×1 IMPLANT
DRAPE U-SHAPE 47X51 STRL (DRAPES) ×3 IMPLANT
DRESSING AQUACEL AG SP 3.5X10 (GAUZE/BANDAGES/DRESSINGS) ×1 IMPLANT
DRSG AQUACEL AG SP 3.5X10 (GAUZE/BANDAGES/DRESSINGS) ×3
DURAPREP 26ML APPLICATOR (WOUND CARE) ×6 IMPLANT
ELECT REM PT RETURN 9FT ADLT (ELECTROSURGICAL) ×3
ELECTRODE REM PT RTRN 9FT ADLT (ELECTROSURGICAL) ×1 IMPLANT
GLOVE BIOGEL PI IND STRL 6.5 (GLOVE) ×1 IMPLANT
GLOVE BIOGEL PI IND STRL 7.0 (GLOVE) ×1 IMPLANT
GLOVE BIOGEL PI IND STRL 7.5 (GLOVE) ×3 IMPLANT
GLOVE BIOGEL PI INDICATOR 6.5 (GLOVE) ×2
GLOVE BIOGEL PI INDICATOR 7.0 (GLOVE) ×2
GLOVE BIOGEL PI INDICATOR 7.5 (GLOVE) ×6
GLOVE ECLIPSE 8.0 STRL XLNG CF (GLOVE) ×6 IMPLANT
GLOVE ORTHO TXT STRL SZ7.5 (GLOVE) ×6 IMPLANT
GLOVE SURG SS PI 6.5 STRL IVOR (GLOVE) ×3 IMPLANT
GLOVE SURG SS PI 7.0 STRL IVOR (GLOVE) ×3 IMPLANT
GOWN STRL REUS W/TWL LRG LVL3 (GOWN DISPOSABLE) ×6 IMPLANT
GOWN STRL REUS W/TWL XL LVL3 (GOWN DISPOSABLE) ×6 IMPLANT
HANDPIECE INTERPULSE COAX TIP (DISPOSABLE) ×2
MANIFOLD NEPTUNE II (INSTRUMENTS) ×3 IMPLANT
PACK TOTAL KNEE CUSTOM (KITS) ×3 IMPLANT
POSITIONER SURGICAL ARM (MISCELLANEOUS) ×3 IMPLANT
SET HNDPC FAN SPRY TIP SCT (DISPOSABLE) ×1 IMPLANT
SET PAD KNEE POSITIONER (MISCELLANEOUS) ×3 IMPLANT
SUT MNCRL AB 4-0 PS2 18 (SUTURE) ×3 IMPLANT
SUT STRATAFIX 0 PDS 27 VIOLET (SUTURE) ×3
SUT VIC AB 1 CT1 36 (SUTURE) ×3 IMPLANT
SUT VIC AB 2-0 CT1 27 (SUTURE) ×6
SUT VIC AB 2-0 CT1 TAPERPNT 27 (SUTURE) ×3 IMPLANT
SUTURE STRATFX 0 PDS 27 VIOLET (SUTURE) ×1 IMPLANT
SYR 50ML LL SCALE MARK (SYRINGE) ×3 IMPLANT
TRAY FOLEY W/METER SILVER 16FR (SET/KITS/TRAYS/PACK) ×3 IMPLANT
WRAP KNEE MAXI GEL POST OP (GAUZE/BANDAGES/DRESSINGS) ×3 IMPLANT
YANKAUER SUCT BULB TIP 10FT TU (MISCELLANEOUS) ×3 IMPLANT

## 2016-05-23 NOTE — Anesthesia Procedure Notes (Addendum)
Spinal  Patient location during procedure: OR Start time: 05/23/2016 7:22 AM End time: 05/23/2016 7:27 AM Reason for block: at surgeon's request Staffing Resident/CRNA: Anne Fu Performed: resident/CRNA  Preanesthetic Checklist Completed: patient identified, site marked, surgical consent, pre-op evaluation, timeout performed, IV checked, risks and benefits discussed and monitors and equipment checked Spinal Block Patient position: sitting Prep: ChloraPrep Patient monitoring: heart rate, continuous pulse ox and blood pressure Approach: right paramedian Location: L2-3 Injection technique: single-shot Needle Needle type: Whitacre  Needle gauge: 25 G Needle length: 9 cm Assessment Sensory level: T6 Additional Notes Expiration date of kit checked and confirmed. Patient tolerated procedure well, without complications. X 1 attempt with noted clear CSF return. Loss of motor and sensory on exam post injection.

## 2016-05-23 NOTE — Anesthesia Postprocedure Evaluation (Signed)
Anesthesia Post Note  Patient: Micheal Lucas  Procedure(s) Performed: Procedure(s) (LRB): RIGHT TOTAL KNEE ARTHROPLASTY (Right)  Patient location during evaluation: PACU Anesthesia Type: Spinal, MAC and Regional Level of consciousness: awake and alert Pain management: pain level controlled Vital Signs Assessment: post-procedure vital signs reviewed and stable Respiratory status: spontaneous breathing and respiratory function stable Cardiovascular status: blood pressure returned to baseline and stable Postop Assessment: spinal receding Anesthetic complications: no    Last Vitals:  Vitals:   05/23/16 1110 05/23/16 1212  BP: 138/74 134/70  Pulse: 75 83  Resp: 16 16  Temp: 36.4 C 36.5 C    Last Pain:  Vitals:   05/23/16 1212  TempSrc: Oral  PainSc:                  Lewie LoronJohn Caeli Linehan

## 2016-05-23 NOTE — Anesthesia Procedure Notes (Signed)
Anesthesia Regional Block:  Adductor canal block  Pre-Anesthetic Checklist: ,, timeout performed, Correct Patient, Correct Site, Correct Laterality, Correct Procedure, Correct Position, site marked, Risks and benefits discussed, Surgical consent,  Pre-op evaluation,  Post-op pain management  Laterality: Right  Prep: chloraprep       Needles:  Injection technique: Single-shot  Needle Type: Stimiplex     Needle Length: 9cm 9 cm Needle Gauge: 21 and 21 G    Additional Needles:  Procedures: ultrasound guided (picture in chart) Adductor canal block Narrative:  Injection made incrementally with aspirations every 5 mL.  Performed by: Personally  Anesthesiologist: Tanaya Dunigan  Additional Notes: BP cuff, EKG monitors applied. Sedation begun. Artery and nerve location verified with U/S and anesthetic injected incrementally, slowly, and after negative aspirations under direct u/s guidance. Good fascial /perineural spread. Tolerated well.      

## 2016-05-23 NOTE — Discharge Instructions (Signed)

## 2016-05-23 NOTE — Evaluation (Signed)
Physical Therapy Evaluation Patient Details Name: Micheal Lucas MRN: [id number] DOB: [birth year] Today's Date: 05/23/2016   History of Present Illness  L TKA  Clinical Impression  The patient ambulatedx 50'. Plans to Dc home. Pt admitted with above diagnosis. Pt currently with functional limitations due to the deficits listed below (see PT Problem List).  Pt will benefit from skilled PT to increase their independence and safety with mobility to allow discharge to the venue listed below.       Follow Up Recommendations Home health PT;Supervision/Assistance - 24 hour    Equipment Recommendations  Rolling walker with 5" wheels    Recommendations for Other Services       Precautions / Restrictions Precautions Precautions: Knee      Mobility  Bed Mobility Overal bed mobility: Needs Assistance Bed Mobility: Supine to Sit;Sit to Supine     Supine to sit: Min assist Sit to supine: Min assist   General bed mobility comments: assist with left leg  Transfers Overall transfer level: Needs assistance Equipment used: Rolling walker (2 wheeled) Transfers: Sit to/from Stand Sit to Stand: Min assist         General transfer comment: cues for hand and Left leg position  Ambulation/Gait Ambulation/Gait assistance: Min assist Ambulation Distance (Feet): 50 Feet Assistive device: Rolling walker (2 wheeled) Gait Pattern/deviations: Step-to pattern;Step-through pattern;Antalgic     General Gait Details: cues for sequence  Stairs            Wheelchair Mobility    Modified Rankin (Stroke Patients Only)       Balance                                             Pertinent Vitals/Pain Pain Assessment: 0-10 Pain Score: 6  Pain Location: Left knee Pain Descriptors / Indicators: Aching;Sore Pain Intervention(s): Limited activity within patient's tolerance;Monitored during session;Premedicated before session;Repositioned;Ice applied    Home  Living Family/patient expects to be discharged to:: Private residence Living Arrangements: Spouse/significant other;Children Available Help at Discharge: Family Type of Home: House Home Access: Stairs to enter Entrance Stairs-Rails: Right Entrance Stairs-Number of Steps: 3-5 Home Layout: One level Home Equipment: None      Prior Function Level of Independence: Independent               Hand Dominance        Extremity/Trunk Assessment   Upper Extremity Assessment: Defer to OT evaluation           Lower Extremity Assessment: LLE deficits/detail   LLE Deficits / Details: able to SLR     Communication   Communication: No difficulties  Cognition Arousal/Alertness: Awake/alert Behavior During Therapy: WFL for tasks assessed/performed Overall Cognitive Status: Within Functional Limits for tasks assessed                      General Comments      Exercises     Assessment/Plan    PT Assessment Patient needs continued PT services  PT Problem List Decreased strength;Decreased range of motion;Decreased activity tolerance;Decreased mobility;Decreased knowledge of precautions;Decreased knowledge of use of DME;Decreased safety awareness;Pain          PT Treatment Interventions DME instruction;Gait training;Stair training;Functional mobility training;Therapeutic activities;Therapeutic exercise;Patient/family education    PT Goals (Current goals can be found in the Care Plan section)  Acute Rehab PT Goals Patient Stated  Goal: to go home PT Goal Formulation: With patient/family Time For Goal Achievement: 05/26/16 Potential to Achieve Goals: Good    Frequency 7X/week   Barriers to discharge        Co-evaluation               End of Session   Activity Tolerance: Patient tolerated treatment well Patient left: in bed;with call bell/phone within reach;with bed alarm set;with family/visitor present Nurse Communication: Mobility status          Time: 1733-1800 PT Time Calculation (min) (ACUTE ONLY): 27 min   Charges:   PT Evaluation $PT Eval Low Complexity: 1 Procedure PT Treatments $Gait Training: 8-22 mins   PT G CodesRada Lucas:        Micheal Lucas 05/23/2016, 6:12 PM Micheal KelchKaren Christorpher Lucas PT [phone]

## 2016-05-23 NOTE — Interval H&P Note (Signed)
History and Physical Interval Note:  05/23/2016 7:06 AM  Micheal Lucas  has presented today for surgery, with the diagnosis of right knee OA  The various methods of treatment have been discussed with the patient and family. After consideration of risks, benefits and other options for treatment, the patient has consented to  Procedure(s): RIGHT TOTAL KNEE ARTHROPLASTY (Right) as a surgical intervention .  The patient's history has been reviewed, patient examined, no change in status, stable for surgery.  I have reviewed the patient's chart and labs.  Questions were answered to the patient's satisfaction.     Shelda PalLIN,Hoyt Leanos D

## 2016-05-23 NOTE — Op Note (Signed)
NAME:  Michah Minton                      MEDICAL RECORD NO.:  [id number]                             FACILITY:  [facility]      PHYSICIAN:  Madlyn Frankel. Charlann Boxer, [name]  DATE OF BIRTH:  1956/05/24      DATE OF PROCEDURE:  05/23/2016                                     OPERATIVE REPORT         PREOPERATIVE DIAGNOSIS:  Right knee osteoarthritis.      POSTOPERATIVE DIAGNOSIS:  Right knee osteoarthritis.      FINDINGS:  The patient was noted to have complete loss of cartilage and   bone-on-bone arthritis with associated osteophytes in the medial and patellofemoral compartments of   the knee with a significant synovitis and associated effusion.      PROCEDURE:  Right total knee replacement.      COMPONENTS USED:  DePuy Attune rotating platform posterior stabilized knee   system, a size 7 femur, 7 tibia, size 8 PS AOX insert, and 41 anatomic patellar   button.      SURGEON:  Madlyn Frankel. Charlann Boxer, [name]      ASSISTANT:  Leilani Able, PA-C.      ANESTHESIA:  Regional and Spinal.      SPECIMENS:  None.      COMPLICATION:  None.      DRAINS:  None.  EBL: <150cc      TOURNIQUET TIME:   Total Tourniquet Time Documented: Thigh (Right) - 36 minutes Total: Thigh (Right) - 36 minutes  .      The patient was stable to the recovery room.      INDICATION FOR PROCEDURE:  Artem Bunte is a 60 y.o. male patient of   mine.  The patient had been seen, evaluated, and treated conservatively in the   office with medication, activity modification, and injections.  The patient had   radiographic changes of bone-on-bone arthritis with endplate sclerosis and osteophytes noted.      The patient failed conservative measures including medication, injections, and activity modification, and at this point was ready for more definitive measures.   Based on the radiographic changes and failed conservative measures, the patient   decided to proceed with total knee replacement.  Risks of infection,   DVT,  component failure, need for revision surgery, postop course, and   expectations were all   discussed and reviewed.  Consent was obtained for benefit of pain   relief.      PROCEDURE IN DETAIL:  The patient was brought to the operative theater.   Once adequate anesthesia, preoperative antibiotics, 2 gm of Ancef, 1 gm of Tranexamic Acid, and 10 mg of Decadron administered, the patient was positioned supine with the right thigh tourniquet placed.  The  right lower extremity was prepped and draped in sterile fashion.  A time-   out was performed identifying the patient, planned procedure, and   extremity.      The right lower extremity was placed in the [facility] leg holder.  The leg was   exsanguinated, tourniquet elevated to 250 mmHg.  A midline incision was   made followed  by median parapatellar arthrotomy.  Following initial   exposure, attention was first directed to the patella.  Precut   measurement was noted to be 27 mm.  I resected down to 15 mm and used a   41 anatomic patellar button to restore patellar height as well as cover the cut   surface.      The lug holes were drilled and a metal shim was placed to protect the   patella from retractors and saw blades.      At this point, attention was now directed to the femur.  The femoral   canal was opened with a drill, irrigated to try to prevent fat emboli.  An   intramedullary rod was passed at 5 degrees valgus, 9 mm of bone was   resected off the distal femur.  Following this resection, the tibia was   subluxated anteriorly.  Using the extramedullary guide, 2 mm of bone was resected off   the proximal medial tibia.  We confirmed the gap would be   stable medially and laterally with a size 6 spacer block as well as confirmed   the cut was perpendicular in the coronal plane, checking with an alignment rod.      Once this was done, I sized the femur to be a size 7 in the anterior-   posterior dimension, chose a standard component  based on medial and   lateral dimension.  The size 7 rotation block was then pinned in   position anterior referenced using the C-clamp to set rotation.  The   anterior, posterior, and  chamfer cuts were made without difficulty nor   notching making certain that I was along the anterior cortex to help   with flexion gap stability.      The final box cut was made off the lateral aspect of distal femur.      At this point, the tibia was sized to be a size 7, the size 7 tray was   then pinned in position through the medial third of the tubercle,   drilled, and keel punched.  Trial reduction was now carried with a 7 femur,  7 tibia, a 7 then 8 PS insert, and the size 41 anatomic patella botton.  The knee was brought to   extension, full extension with good flexion stability with the patella   tracking through the trochlea without application of pressure.  Given   all these findings the femoral lug holes were drilled and then the trial components removed.  Final components were   opened and cement was mixed.  The knee was irrigated with normal saline   solution and pulse lavage.  The synovial lining was   then injected with 30 cc 0.25% Marcaine without epinephrine and 1 cc of Toradol plus 30 cc of NS for a   total of 61 cc.      The knee was irrigated.  Final implants were then cemented onto clean and   dried cut surfaces of bone with the knee brought to extension with a size 8 PS trial insert.      Once the cement had fully cured, the excess cement was removed   throughout the knee.  I confirmed I was satisfied with the range of   motion and stability, and the final size 8 PS AOX insert was chosen.  It was   placed into the knee.      The tourniquet had been let down at 36 minutes.  No significant   hemostasis required.  The   extensor mechanism was then reapproximated using #1 Vicryl and #1 Stratfix with the knee   in flexion.  The   remaining wound was closed with 2-0 Vicryl and  running 4-0 Monocryl.   The knee was cleaned, dried, dressed sterilely using Dermabond and   Aquacel dressing.  The patient was then   brought to recovery room in stable condition, tolerating the procedure   well.   Please note that Physician Assistant, Leilani AbleSteve Chabon, PA-C, was present for the entirety of the case, and was utilized for pre-operative positioning, peri-operative retractor management, general facilitation of the procedure.  He was also utilized for primary wound closure at the end of the case.              Madlyn FrankelMatthew D. Charlann Boxerlin, [name]    05/23/2016 8:48 AM

## 2016-05-23 NOTE — Transfer of Care (Signed)
Immediate Anesthesia Transfer of Care Note  Patient: Micheal Lucas  Procedure(s) Performed: Procedure(s) with comments: RIGHT TOTAL KNEE ARTHROPLASTY (Right) - Adductor block  Patient Location: PACU  Anesthesia Type:Spinal  Level of Consciousness:  sedated, patient cooperative and responds to stimulation  Airway & Oxygen Therapy:Patient Spontanous Breathing and Patient connected to face mask oxgen  Post-op Assessment:  Report given to PACU RN and Post -op Vital signs reviewed and stable  Post vital signs:  Reviewed and stable  Last Vitals:  Vitals:   05/23/16 0507  BP: (!) 156/83  Pulse: 92  Resp: 18  Temp: 36.4 C    Complications: No apparent anesthesia complications

## 2016-05-24 DIAGNOSIS — E669 Obesity, unspecified: Secondary | ICD-10-CM | POA: Diagnosis present

## 2016-05-24 LAB — BASIC METABOLIC PANEL
Anion gap: 7 (ref 5–15)
BUN: 17 mg/dL (ref 6–20)
CO2: 27 mmol/L (ref 22–32)
Calcium: 9.3 mg/dL (ref 8.9–10.3)
Chloride: 102 mmol/L (ref 101–111)
Creatinine, Ser: 1.03 mg/dL (ref 0.61–1.24)
GFR calc Af Amer: >60 mL/min (ref >60)
GLUCOSE: 156 mg/dL — AB (ref 65–99)
POTASSIUM: 4.4 mmol/L (ref 3.5–5.1)
Sodium: 136 mmol/L (ref 135–145)

## 2016-05-24 LAB — CBC
HEMATOCRIT: 36.2 % — AB (ref 39.0–52.0)
HEMOGLOBIN: 11.5 g/dL — AB (ref 13.0–17.0)
MCH: 22.4 pg — AB (ref 26.0–34.0)
MCHC: 31.8 g/dL (ref 30.0–36.0)
MCV: 70.4 fL — AB (ref 78.0–100.0)
PLATELETS: 209 10*3/uL (ref 150–400)
RBC: 5.14 MIL/uL (ref 4.22–5.81)
RDW: 15.6 % — ABNORMAL HIGH (ref 11.5–15.5)
WBC: 18 10*3/uL — AB (ref 4.0–10.5)

## 2016-05-24 MED ORDER — ASPIRIN 81 MG PO CHEW: 81.0000 mg | CHEWABLE_TABLET | Freq: Two times a day (BID) | ORAL | 0 refills | Status: AC

## 2016-05-24 MED ORDER — FERROUS SULFATE 325 (65 FE) MG PO TABS: 325.0000 mg | ORAL_TABLET | Freq: Three times a day (TID) | ORAL | 3 refills | Status: AC

## 2016-05-24 MED ORDER — POLYETHYLENE GLYCOL 3350 17 G PO PACK: 17.0000 g | PACK | Freq: Two times a day (BID) | ORAL | 0 refills | Status: DC

## 2016-05-24 MED ORDER — METHOCARBAMOL 500 MG PO TABS: 500.0000 mg | ORAL_TABLET | Freq: Four times a day (QID) | ORAL | 0 refills | Status: DC | PRN

## 2016-05-24 MED ORDER — HYDROCODONE-ACETAMINOPHEN 7.5-325 MG PO TABS: 1.0000 | ORAL_TABLET | ORAL | 0 refills | Status: DC | PRN

## 2016-05-24 MED ORDER — DOCUSATE SODIUM 100 MG PO CAPS: 100.0000 mg | ORAL_CAPSULE | Freq: Two times a day (BID) | ORAL | 0 refills | Status: DC

## 2016-05-24 NOTE — Progress Notes (Signed)
Occupational Therapy Evaluation Patient Details Name: Micheal Lucas MRN: [id number] DOB: [birth year] Today's Date: 05/24/2016    History of Present Illness R TKA   Clinical Impression   All OT education completed and pt questions answered. No further OT needs at this time. Will sign off.    Follow Up Recommendations  No OT follow up;Supervision - Intermittent    Equipment Recommendations  None recommended by OT    Recommendations for Other Services       Precautions / Restrictions Precautions Precautions: Knee Restrictions Weight Bearing Restrictions: No Other Position/Activity Restrictions: WBAT      Mobility Bed Mobility               General bed mobility comments: NT -- OOB with PT  Transfers Overall transfer level: Needs assistance Equipment used: Rolling walker (2 wheeled) Transfers: Sit to/from Stand Sit to Stand: Supervision              Balance                                            ADL Overall ADL's : Needs assistance/impaired Eating/Feeding: Independent;Sitting   Grooming: Supervision/safety;Standing   Upper Body Bathing: Supervision/ safety;Standing   Lower Body Bathing: Supervison/ safety;Sit to/from stand   Upper Body Dressing : Set up;Sitting   Lower Body Dressing: Supervision/safety;Sit to/from stand   Toilet Transfer: Supervision/safety;Ambulation;Regular Toilet;RW   Toileting- Clothing Manipulation and Hygiene: Supervision/safety;Sit to/from stand   Tub/ Shower Transfer: Tub transfer;Minimal assistance;Ambulation;Rolling walker   Functional mobility during ADLs: Supervision/safety;Rolling walker       Vision     Perception     Praxis      Pertinent Vitals/Pain Pain Assessment: 0-10 Pain Score: 5  Pain Location: R knee Pain Descriptors / Indicators: Sore Pain Intervention(s): Monitored during session;Premedicated before session;Ice applied     Hand Dominance      Extremity/Trunk Assessment Upper Extremity Assessment Upper Extremity Assessment: Overall WFL for tasks assessed   Lower Extremity Assessment Lower Extremity Assessment: Defer to PT evaluation   Cervical / Trunk Assessment Cervical / Trunk Assessment: Normal   Communication Communication Communication: No difficulties   Cognition Arousal/Alertness: Awake/alert Behavior During Therapy: WFL for tasks assessed/performed Overall Cognitive Status: Within Functional Limits for tasks assessed                     General Comments       Exercises       Shoulder Instructions      Home Living Family/patient expects to be discharged to:: Private residence Living Arrangements: Spouse/significant other;Children Available Help at Discharge: Family Type of Home: House Home Access: Stairs to enter Secretary/administrator of Steps: 3-5 Entrance Stairs-Rails: Right Home Layout: One level     Bathroom Shower/Tub: Chief Strategy Officer: Standard Bathroom Accessibility: Yes How Accessible: Accessible via walker Home Equipment: None   Additional Comments: vanity next to toilet      Prior Functioning/Environment Level of Independence: Independent        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: Decreased strength;Decreased range of motion;Pain   OT Treatment/Interventions:      OT Goals(Current goals can be found in the care plan section) Acute Rehab OT Goals Patient Stated Goal: to go home OT Goal Formulation: All assessment and education complete, DC  therapy  OT Frequency:     Barriers to D/C:            Co-evaluation              End of Session Equipment Utilized During Treatment: Rolling walker Nurse Communication: Mobility status  Activity Tolerance: Patient tolerated treatment well Patient left: in chair;with call bell/phone within reach;with chair alarm set;with family/visitor present   Time: 1610-96041143-1208 OT Time  Calculation (min): 25 min Charges:  OT General Charges $OT Visit: 1 Procedure OT Evaluation $OT Eval Low Complexity: 1 Procedure OT Treatments $Self Care/Home Management : 8-22 mins G-Codes:    Jadine Brumley A 05/24/2016, 12:15 PM

## 2016-05-24 NOTE — Progress Notes (Signed)
Physical Therapy Treatment Patient Details Name: Micheal PerchWilliam Lucas MRN: [id number] DOB: 12/21/1955 Today's Date: 05/24/2016    History of Present Illness R TKA    PT Comments    Ready to DC.  Follow Up Recommendations  Home health PT;Supervision/Assistance - 24 hour     Equipment Recommendations  Rolling walker with 5" wheels    Recommendations for Other Services       Precautions / Restrictions Precautions Precautions: Knee    Mobility  Bed Mobility   Bed Mobility: Supine to Sit     Supine to sit: Supervision        Transfers   Equipment used: Rolling walker (2 wheeled) Transfers: Sit to/from Stand Sit to Stand: Supervision         General transfer comment: cues for hand and Left leg position  Ambulation/Gait Ambulation/Gait assistance: Supervision Ambulation Distance (Feet): 50 Feet Assistive device: Rolling walker (2 wheeled) Gait Pattern/deviations: Step-through pattern     General Gait Details: cues for sequence   Stairs Stairs: Yes Stairs assistance: Min guard Stair Management: Forwards;Backwards;With walker;With crutches;No rails Number of Stairs: 2 General stair comments: instructed in RRW and crutches  Wheelchair Mobility    Modified Rankin (Stroke Patients Only)       Balance                                    Cognition Arousal/Alertness: Awake/alert                          Exercises     General Comments        Pertinent Vitals/Pain Pain Score: 5  Pain Location: right knee Pain Descriptors / Indicators: Sore Pain Intervention(s): Repositioned;Patient requesting pain meds-RN notified    Home Living                      Prior Function            PT Goals (current goals can now be found in the care plan section) Progress towards PT goals: Progressing toward goals    Frequency    7X/week      PT Plan Current plan remains appropriate    Co-evaluation              End of Session   Activity Tolerance: Patient tolerated treatment well Patient left:  (uop with wife)     Time: 9147-82951352-1401 PT Time Calculation (min) (ACUTE ONLY): 9 min  Charges:  $Gait Training: 8-22 mins $Therapeutic Exercise: 8-22 mins                    G Codes:      Rada HayHill, Ibn Stief [name] 05/24/2016, 6:03 PM

## 2016-05-24 NOTE — Progress Notes (Signed)
RN reviewed discharge instructions with patient and family. All questions answered.   Paperwork and prescriptions given. [facility]   NT rolled patient down with all belongings to family car.

## 2016-05-24 NOTE — Progress Notes (Signed)
Physical Therapy Treatment Patient Details Name: Micheal Lucas MRN: [id number] DOB: 05/29/1956 Today's Date: 05/24/2016    History of Present Illness R TKA    PT Comments    Progressing well  Follow Up Recommendations  Home health PT;Supervision/Assistance - 24 hour     Equipment Recommendations  Rolling walker with 5" wheels    Recommendations for Other Services       Precautions / Restrictions Precautions Precautions: Knee    Mobility  Bed Mobility   Bed Mobility: Supine to Sit     Supine to sit: Supervision        Transfers   Equipment used: Rolling walker (2 wheeled) Transfers: Sit to/from Stand Sit to Stand: Supervision         General transfer comment: cues for hand and Left leg position  Ambulation/Gait Ambulation/Gait assistance: Supervision Ambulation Distance (Feet): 400 Feet Assistive device: Rolling walker (2 wheeled) Gait Pattern/deviations: Step-through pattern     General Gait Details: cues for sequence   Stairs            Wheelchair Mobility    Modified Rankin (Stroke Patients Only)       Balance                                    Cognition Arousal/Alertness: Awake/alert                          Exercises Total Joint Exercises Ankle Circles/Pumps: AROM;Both;10 reps Quad Sets: AROM;Both;10 reps Towel Squeeze: AROM;Left;10 reps Short Arc Quad: AROM;Left;10 reps Heel Slides: AROM;Left;10 reps Hip ABduction/ADduction: AROM;Left;10 reps Straight Leg Raises: AROM;Left;10 reps    General Comments        Pertinent Vitals/Pain Pain Score: 4  Pain Location: right knee Pain Descriptors / Indicators: Sore Pain Intervention(s): Monitored during session;Premedicated before session    Home Living                      Prior Function            PT Goals (current goals can now be found in the care plan section) Progress towards PT goals: Progressing toward goals     Frequency    7X/week      PT Plan Current plan remains appropriate    Co-evaluation             End of Session   Activity Tolerance: Patient tolerated treatment well Patient left:  (with OT)     Time: [id number] PT Time Calculation (min) (ACUTE ONLY): 23 min  Charges:  $Gait Training: 8-22 mins $Therapeutic Exercise: 8-22 mins                    G Codes:      Micheal Lucas, Micheal Lucas Micheal Lucas 05/24/2016, 6:00 PM

## 2016-05-24 NOTE — Progress Notes (Signed)
     Subjective: 1 Day Post-Op Procedure(s) (LRB): RIGHT TOTAL KNEE ARTHROPLASTY (Right)   Patient reports pain as mild, pain well controlled.  No events throughout the night. Looking forward to recovering and working with PT.  Ready to be discharged home.   Objective:   VITALS:   Vitals:   05/24/16 0137 05/24/16 0522  BP: 134/66 131/67  Pulse: 84 75  Resp: 16 16  Temp: 98 F (36.7 C) 98 F (36.7 C)    Dorsiflexion/Plantar flexion intact Incision: dressing C/D/I No cellulitis present Compartment soft  LABS  Recent Labs  05/24/16 0457  HGB 11.5*  HCT 36.2*  WBC 18.0*  PLT 209     Recent Labs  05/24/16 0457  NA 136  K 4.4  BUN 17  CREATININE 1.03  GLUCOSE 156*     Assessment/Plan: 1 Day Post-Op Procedure(s) (LRB): RIGHT TOTAL KNEE ARTHROPLASTY (Right) Foley cath d/c'ed Advance diet Up with therapy D/C IV fluids Discharge home with home health  [facility] Follow up with OLIN,Allyssia Skluzacek D in 2 weeks.  Contact information:  [facility] [address] [place] [id number] [phone]    Obese (BMI 30-39.9) Estimated body mass index is 30.48 kg/m as calculated from the following:   Height as of this encounter: 6\' 1"  (1.854 m).   Weight as of this encounter: 104.8 kg (231 lb). Patient also counseled that weight may inhibit the healing process Patient counseled that losing weight will help with future health issues        Anastasio AuerbachMatthew S. Karsten Vaughn   PAC  05/24/2016, 9:10 AM

## 2016-05-24 NOTE — Care Management Note (Signed)
Case Management Note  Patient Details  Name: Jaye Polidori MRN: [id number] Date of Birth: May 07, 1956  Subjective/Objective:                    Action/Plan:   Expected Discharge Date:                  Expected Discharge Plan:  [place]  In-House Referral:     Discharge planning Services  CM Consult  Post Acute Care Choice:  Home Health Choice offered to:  Patient  DME Arranged:  Walker rolling DME Agency:  [place]:  PT [facility] Agency:  [facility])  Status of Service:  Completed, signed off  If discussed at [employer] of Stay Meetings, dates discussed:    Additional Comments: CM met with pt in room to offer choice of home health agency.  Pt chooses [facility] to render HHPT.  Referral given to [facility] rep, Tim.  CM notified [place] rep, Jermaine to please deliver the rolling walker to room prior to discharge.  No  Other CM needs were communicated. Dellie Catholic, RN 05/24/2016, 11:12 AM

## 2016-05-29 NOTE — Discharge Summary (Signed)
Physician Discharge Summary  Patient ID: Micheal Lucas MRN: [id number] DOB/AGE: [birth year] 61 y.o.  Admit date: 05/23/2016 Discharge date: 05/24/2016   Procedures:  Procedure(s) (LRB): RIGHT TOTAL KNEE ARTHROPLASTY (Right)  Attending Physician:  Dr. Durene Romans   Admission Diagnoses:   Right knee primary OA / pain  Discharge Diagnoses:  Principal Problem:   S/P right TKA Active Problems:   S/P knee replacement   Obese  Past Medical History:  Diagnosis Date  . Anemia   . Arthritis    oa  . Bruit of right carotid artery   . Cataract    right eye  . Elevated cholesterol   . GERD (gastroesophageal reflux disease)   . Hypertension   . Plantar fascial fibromatosis     HPI:    Micheal Lucas, 59 y.o. male, has a history of pain and functional disability in the right knee due to arthritis and has failed non-surgical conservative treatments for greater than 12 weeks to includeNSAID's and/or analgesics, corticosteriod injections and activity modification.  Onset of symptoms was gradual, starting 1+ years ago with gradually worsening course since that time. The patient noted no past surgery on the right knee(s).  Patient currently rates pain in the right knee(s) at 8 out of 10 with activity. Patient has night pain, worsening of pain with activity and weight bearing, pain that interferes with activities of daily living, pain with passive range of motion, crepitus and joint swelling.  Patient has evidence of periarticular osteophytes and joint space narrowing by imaging studies.  There is no active infection.  Risks, benefits and expectations were discussed with the patient.  Risks including but not limited to the risk of anesthesia, blood clots, nerve damage, blood vessel damage, failure of the prosthesis, infection and up to and including death.  Patient understand the risks, benefits and expectations and wishes to proceed with surgery.  PCP: Mattie Marlin, MD   Discharged  Condition: good  Hospital Course:  Patient underwent the above stated procedure on 05/23/2016. Patient tolerated the procedure well and brought to the recovery room in good condition and subsequently to the floor.  POD #1 BP: 131/67 ; Pulse: 75 ; Temp: 98 F (36.7 C) ; Resp: 16 Patient reports pain as mild, pain well controlled.  No events throughout the night. Looking forward to recovering and working with PT.  Ready to be discharged home. Dorsiflexion/plantar flexion intact, incision: dressing C/D/I, no cellulitis present and compartment soft.   LABS  Basename    HGB     11.5  HCT     36.2    Discharge Exam: General appearance: alert, cooperative and no distress Extremities: Homans sign is negative, no sign of DVT, no edema, redness or tenderness in the calves or thighs and no ulcers, gangrene or trophic changes  Disposition: Home with follow up in 2 weeks   Follow-up Information    Shelda Pal, MD. Schedule an appointment as soon as possible for a visit in 2 week(s).   Specialty:  Orthopedic Surgery Contact information: [address] [place] [address] [phone]        [facility] .   Specialty:  Home Health Services Why:  home health physical therapy Contact information: [address] [place] [address] [place] [address] [phone]        Inc[facility] - [facility] .   Why:  rolling walker Contact information: [address] [place] [address] [phone]  Discharge Instructions    Call MD / Call 911    Complete by:  As directed    If you experience chest pain or shortness of breath, CALL 911 and be transported to the hospital emergency room.  If you develope a fever above 101 F, pus (white drainage) or increased drainage or redness at the wound, or calf pain, call your surgeon's office.   Change dressing    Complete by:  As directed    Maintain surgical dressing until follow up in the clinic. If the  edges start to pull up, may reinforce with tape. If the dressing is no longer working, may remove and cover with gauze and tape, but must keep the area dry and clean.  Call with any questions or concerns.   Constipation Prevention    Complete by:  As directed    Drink plenty of fluids.  Prune juice may be helpful.  You may use a stool softener, such as Colace (over the counter) 100 mg twice a day.  Use MiraLax (over the counter) for constipation as needed.   Diet - low sodium heart healthy    Complete by:  As directed    Discharge instructions    Complete by:  As directed    Maintain surgical dressing until follow up in the clinic. If the edges start to pull up, may reinforce with tape. If the dressing is no longer working, may remove and cover with gauze and tape, but must keep the area dry and clean.  Follow up in 2 weeks at [facility]. Call with any questions or concerns.   Increase activity slowly as tolerated    Complete by:  As directed    Weight bearing as tolerated with assist device (walker, cane, etc) as directed, use it as long as suggested by your surgeon or therapist, typically at least 4-6 weeks.   TED hose    Complete by:  As directed    Use stockings (TED hose) for 2 weeks on both leg(s).  You may remove them at night for sleeping.        Medication List    STOP taking these medications   aspirin EC 81 MG tablet Replaced by:  aspirin 81 MG chewable tablet     TAKE these medications   aspirin 81 MG chewable tablet Chew 1 tablet (81 mg total) by mouth 2 (two) times daily. Take for 4 weeks, then resume regular dose. Replaces:  aspirin EC 81 MG tablet   carvedilol 3.125 MG tablet Commonly known as:  COREG Take 3.125 mg by mouth 2 (two) times daily with a meal.   docusate sodium 100 MG capsule Commonly known as:  COLACE Take 1 capsule (100 mg total) by mouth 2 (two) times daily.   dorzolamide-timolol 22.3-6.8 MG/ML ophthalmic solution Commonly known as:   COSOPT Place 1 drop into the left eye 2 (two) times daily.   ferrous sulfate 325 (65 FE) MG tablet Take 1 tablet (325 mg total) by mouth 3 (three) times daily after meals.   HYDROcodone-acetaminophen 7.5-325 MG tablet Commonly known as:  NORCO Take 1-2 tablets by mouth every 4 (four) hours as needed for moderate pain.   methocarbamol 500 MG tablet Commonly known as:  ROBAXIN Take 1 tablet (500 mg total) by mouth every 6 (six) hours as needed for muscle spasms.   multivitamin with minerals Tabs tablet Take 1 tablet by mouth daily.   omeprazole 20 MG capsule Commonly known as:  PRILOSEC Take  20 mg by mouth daily.   polyethylene glycol packet Commonly known as:  MIRALAX / GLYCOLAX Take 17 g by mouth 2 (two) times daily.   pravastatin 40 MG tablet Commonly known as:  PRAVACHOL Take 40 mg by mouth every evening.        Signed: Anastasio AuerbachMatthew S. Tressy Kunzman   PA-C  05/29/2016, 7:57 AM

## 2017-08-22 ENCOUNTER — Telehealth: Payer: Self-pay | Admitting: *Deleted

## 2017-08-22 NOTE — Telephone Encounter (Signed)
Received request for Medical Records from [facility] [employer] for [employer] claim; forwarded to [place] for email/scan/SLS 01/09

## 2019-09-09 ENCOUNTER — Other Ambulatory Visit (HOSPITAL_COMMUNITY): Payer: Self-pay | Admitting: Orthopedic Surgery

## 2019-09-09 ENCOUNTER — Other Ambulatory Visit: Payer: Self-pay | Admitting: Orthopedic Surgery

## 2019-09-09 DIAGNOSIS — Z96651 Presence of right artificial knee joint: Secondary | ICD-10-CM

## 2019-09-17 ENCOUNTER — Other Ambulatory Visit: Payer: Self-pay

## 2019-09-17 ENCOUNTER — Encounter (HOSPITAL_COMMUNITY)
Admission: RE | Admit: 2019-09-17 | Discharge: 2019-09-17 | Disposition: A | Discharge status: Home / Self Care | Payer: BC Managed Care – PPO | Source: Ambulatory Visit | Attending: Orthopedic Surgery | Admitting: Orthopedic Surgery

## 2019-09-17 DIAGNOSIS — Z96651 Presence of right artificial knee joint: Secondary | ICD-10-CM | POA: Diagnosis not present

## 2019-09-17 MED ORDER — TECHNETIUM TC 99M MEDRONATE IV KIT
20.8000 | PACK | Freq: Once | INTRAVENOUS | Status: AC | PRN
  Administered 2019-09-17: 09:00:00 20.8 via INTRAVENOUS

## 2020-09-27 ENCOUNTER — Other Ambulatory Visit: Payer: Self-pay | Admitting: Nurse Practitioner

## 2020-09-27 DIAGNOSIS — M5416 Radiculopathy, lumbar region: Secondary | ICD-10-CM

## 2020-10-13 ENCOUNTER — Ambulatory Visit
Admission: RE | Admit: 2020-10-13 | Discharge: 2020-10-13 | Disposition: A | Discharge status: Home / Self Care | Payer: BC Managed Care – PPO | Source: Ambulatory Visit | Attending: Nurse Practitioner | Admitting: Nurse Practitioner

## 2020-10-13 ENCOUNTER — Other Ambulatory Visit: Payer: Self-pay

## 2020-10-13 DIAGNOSIS — M5416 Radiculopathy, lumbar region: Secondary | ICD-10-CM

## 2022-09-17 IMAGING — MR MR LUMBAR SPINE W/O CM
4 of 5 series · 27 of 48 positions shown · non-contrast
Comparison: None.

CLINICAL DATA: Low back pain and bilateral leg pain.

EXAM:
MRI LUMBAR SPINE WITHOUT CONTRAST
TECHNIQUE: Multiplanar, multisequence MR imaging of the lumbar spine was
performed. No intravenous contrast was administered.

[Series 3: T2 · sagittal · 4.0mm · 1.09mm/px · 6 of 17 slices shown (1 of 2)]
[im 1/17]
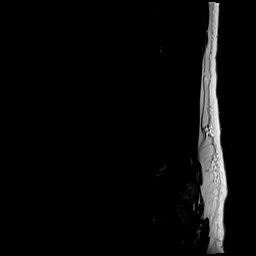
[im 4/17]
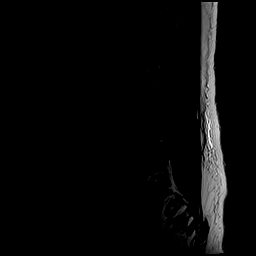
[im 7/17]
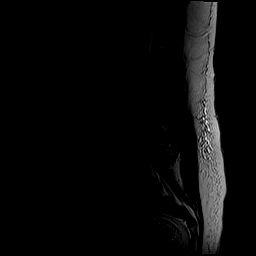
[im 10/17]
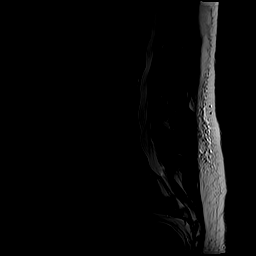
[im 13/17]
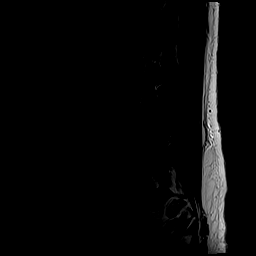
[im 17/17]
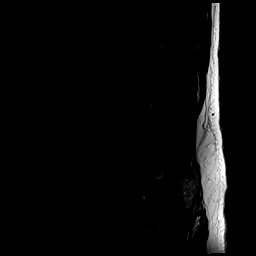

[Series 5: T1 · sagittal · 4.0mm · 1.09mm/px · 6 of 17 slices shown (1 of 2)]
[im 1/17]
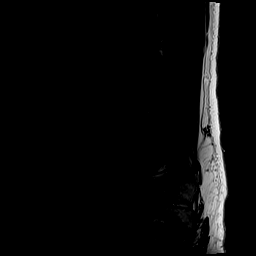
[im 4/17]
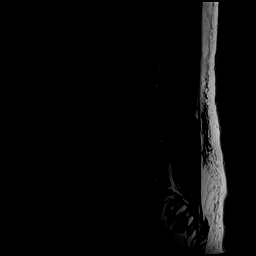
[im 7/17]
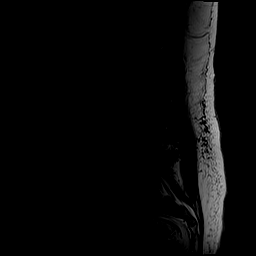
[im 10/17]
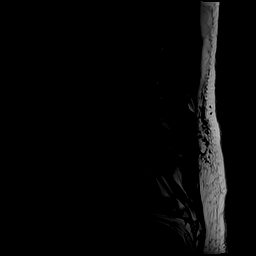
[im 13/17]
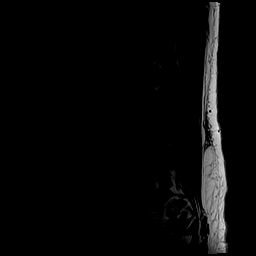
[im 17/17]
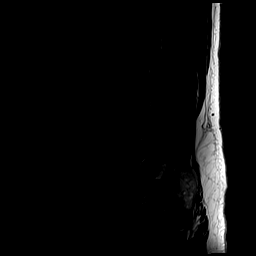

[Series 6: T2 · axial · 4.0mm · 0.39mm/px · z∈[-89,+144]mm · 9 of 45 slices shown (2 of 2)]
[im 1/45]
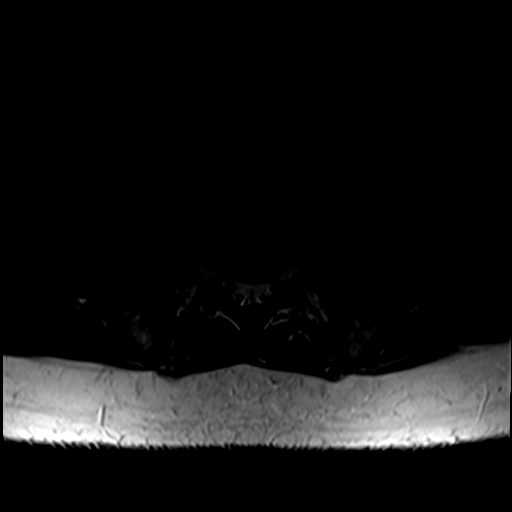
[im 7/45]
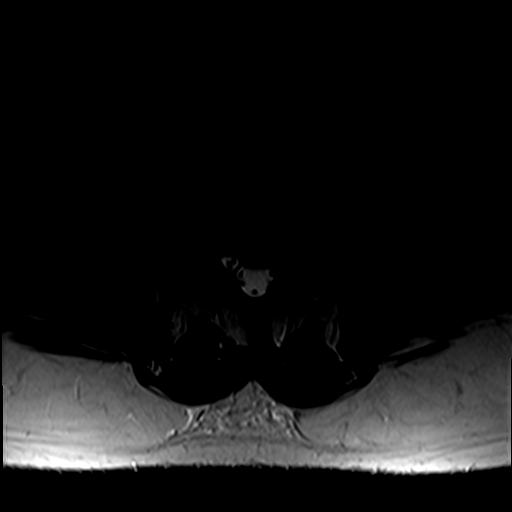
[im 13/45]
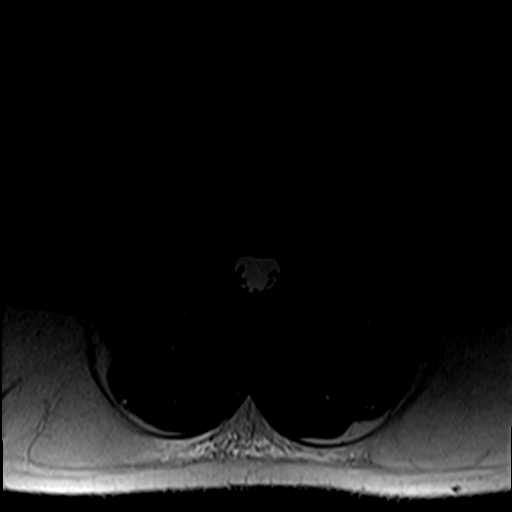
[im 19/45]
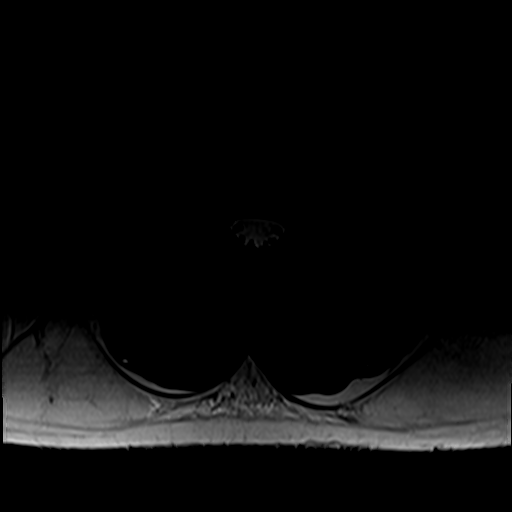
[im 23/45]
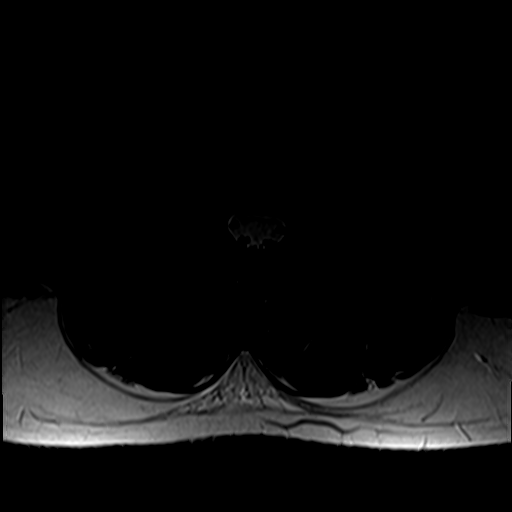
[im 26/45]
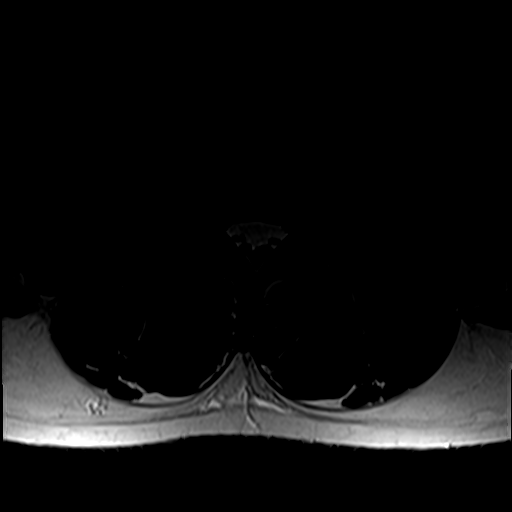
[im 32/45]
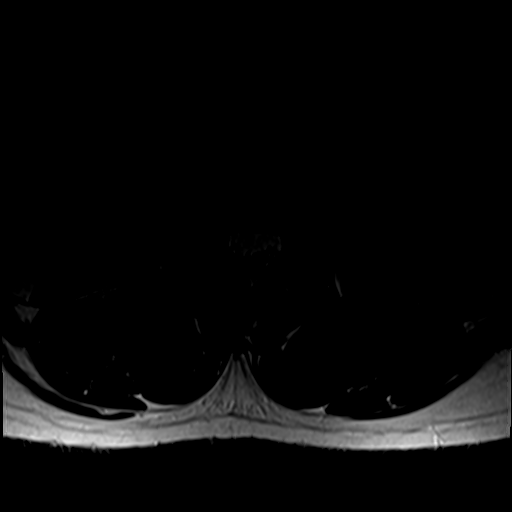
[im 38/45]
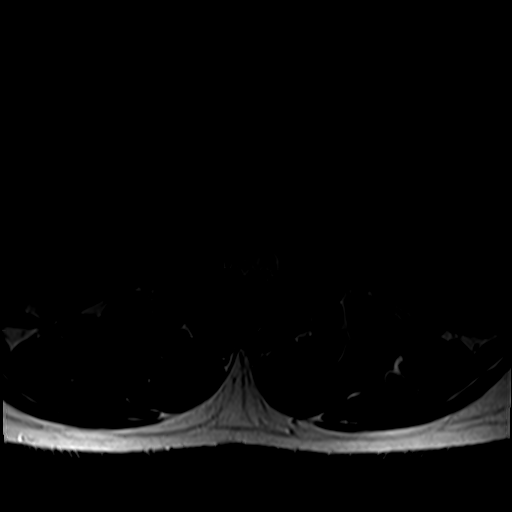
[im 45/45]
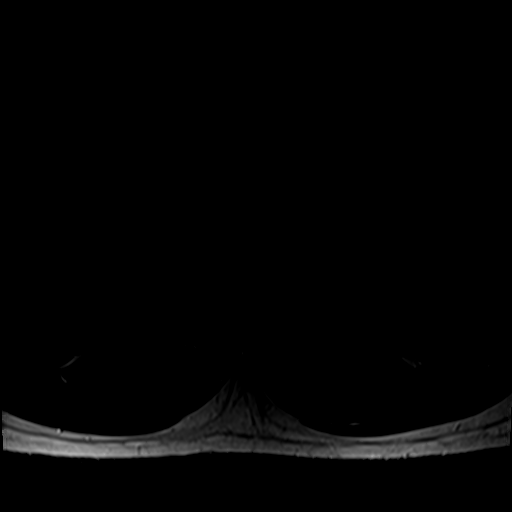

[Series 7: T1 · axial · 4.0mm · 0.39mm/px · z∈[-89,+111]mm · 6 of 45 slices shown (2 of 2)]
[im 1/45]
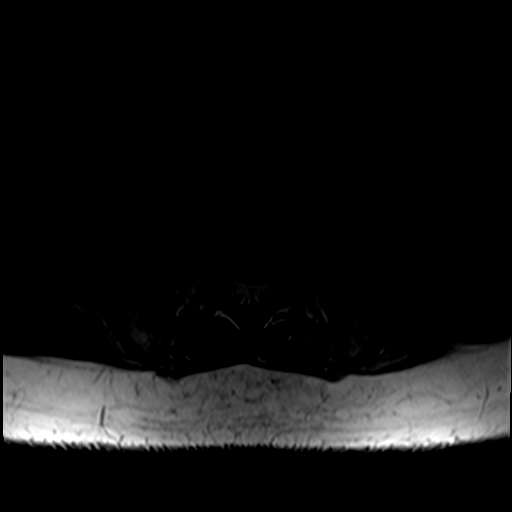
[im 7/45]
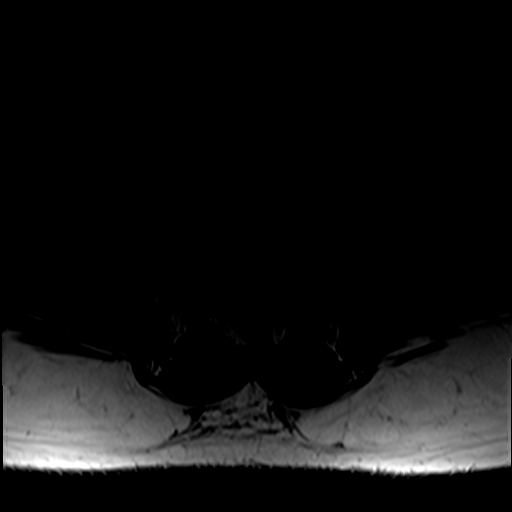
[im 13/45]
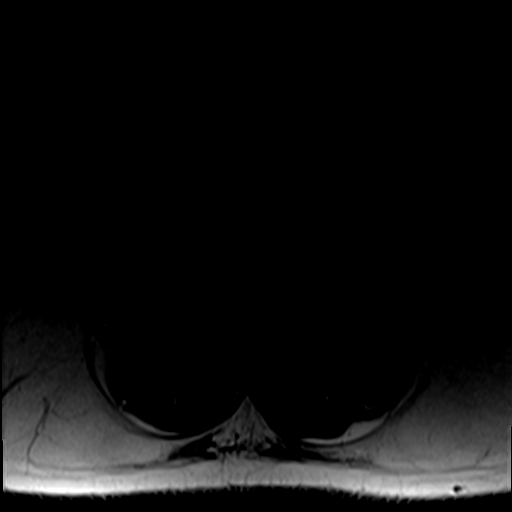
[im 19/45]
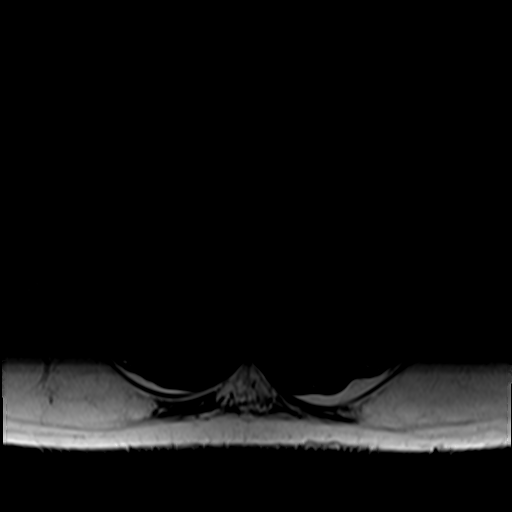
[im 23/45]
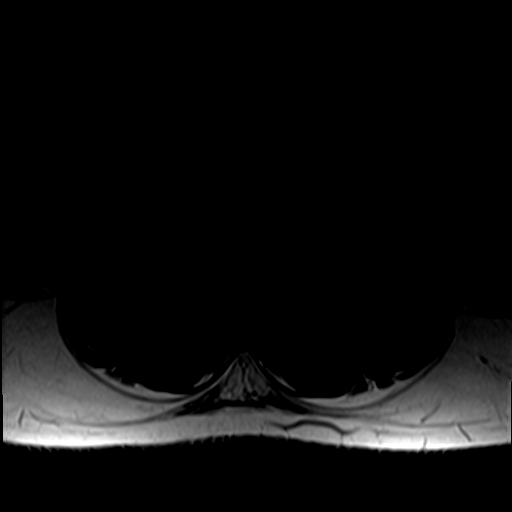
[im 38/45]
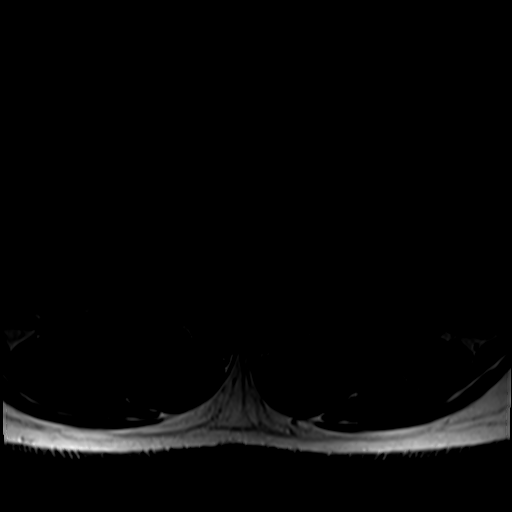

[27 of 48 positions shown; findings below may reference images not displayed]

FINDINGS: Segmentation: Standard segmentation is assumed. The inferior-most
fully formed intervertebral disc is labeled L5-S1.

Alignment:  Slight anterolisthesis of L4 on L5 (grade 1).

Vertebrae: Vertebral body heights are maintained. No specific
evidence of acute fracture, discitis/osteomyelitis, or suspicious
bone lesion.

Conus medullaris and cauda equina: Conus extends to the T12 level.
Conus appears normal.

Paraspinal and other soft tissues: Unremarkable. Bilateral
parapelvic cysts, suboptimally evaluated. Mild perifacet edema at
L4-L5 and L5-S1.

Disc levels:

T12-L1: Mild disc bulging without significant canal or foraminal
stenosis.

L1-L2: Mild disc bulging without significant canal or foraminal
stenosis.

L2-L3: Disc bulging without significant canal or foraminal stenosis.

L3-L4: Broad disc bulge and moderate bilateral facet hypertrophy.
Mild bilateral foraminal stenosis. No significant canal stenosis.
Mild bilateral subarticular recess stenosis.

L4-L5: Broad disc bulge and moderate bilateral facet hypertrophy.
Mild bilateral foraminal stenosis and moderate bilateral
subarticular recess stenosis with disc contacting bilateral
descending L4 nerve roots. No significant central canal stenosis.

L5-S1: Facet hypertrophy. No significant disc protrusion, foraminal
stenosis, or canal stenosis. Tapering of the canal.
IMPRESSION: 1. At L4-L5 there is moderate bilateral subarticular recess stenosis
without substantial central canal stenosis.
2. Mild foraminal stenosis bilaterally at L3-L4 and L4-L5.

## 2022-10-03 NOTE — Patient Instructions (Addendum)
DUE TO COVID-19 ONLY TWO VISITORS  (aged 65 and older)  ARE ALLOWED TO COME WITH YOU AND STAY IN THE WAITING ROOM ONLY DURING PRE OP AND PROCEDURE.    IF YOU WILL BE ADMITTED INTO THE HOSPITAL YOU ARE ALLOWED ONLY FOUR SUPPORT PEOPLE DURING VISITATION HOURS ONLY (7 AM -8PM)   The support person(s) must pass our screening, gel in and out, and wear a mask at all times, including in the patient's room. Patients must also wear a mask when staff or their support person are in the room. Visitors GUEST BADGE MUST BE WORN VISIBLY  One adult visitor may remain with you overnight and MUST be in the room by 8 P.M.     Your procedure is scheduled on: 10/19/22   Report to [facility] Main Entrance    Report to admitting at 6:00 AM   Call this number if you have problems the morning of surgery [phone]   Do not eat food :After Midnight.   After Midnight you may have the following liquids until _5:30_ AM/ DAY OF SURGERY  Water Black Coffee (sugar ok, NO MILK/CREAM OR CREAMERS)  Tea (sugar ok, NO MILK/CREAM OR CREAMERS) regular and decaf                             Plain Jell-O (NO RED)                                           Fruit ices (not with fruit pulp, NO RED)                                     Popsicles (NO RED)                                                                  Juice: apple, WHITE grape, WHITE cranberry Sports drinks like Gatorade (NO RED)     The day of surgery:  Drink ONE (1) Pre-Surgery Clear Ensure at 5:15 AM the morning of surgery. Drink in one sitting. Do not sip.  This drink was given to you during your hospital  pre-op appointment visit. Nothing else to drink after completing the  Pre-Surgery Clear Ensure at 5:30 AM          If you have questions, please contact your surgeon's office.    Oral Hygiene is also important to reduce your risk of infection.                                    Remember - BRUSH YOUR TEETH THE MORNING OF SURGERY WITH YOUR  REGULAR TOOTHPASTE  DENTURES WILL BE REMOVED PRIOR TO SURGERY PLEASE DO NOT APPLY "Poly grip" OR ADHESIVES!!!   Do NOT smoke after Midnight   Take these medicines the morning of surgery with A SIP OF WATER: Pain med if needed  Eye drops as usual                                                                                                                           Pravastatin                                                                                                                           Carvedilol                                                                                                                           Omeprazole   Bring CPAP mask and tubing day of surgery.                              You may not have any metal on your body including  jewelry, and body piercing             Do not wear  lotions, powders, cologne, or deodorant                Men may shave face and neck.   Do not bring valuables to the hospital. [place].   Contacts, glasses, or bridgework may not be worn into surgery.   Bring small overnight bag day of surgery.   DO NOT [place]. PHARMACY WILL DISPENSE MEDICATIONS LISTED ON YOUR MEDICATION LIST TO YOU DURING YOUR ADMISSION [place]!       Special Instructions: Bring a copy of your healthcare power of attorney and living will documents the day of surgery if you haven't scanned them before.              Please read over the following fact sheets you  were given: IF YOU HAVE QUESTIONS ABOUT YOUR PRE-OP INSTRUCTIONS PLEASE CALL [phone]    [facility] - Preparing for Surgery Before surgery, you can play an important role.  Because skin is not sterile, your skin needs to be as free of germs as possible.  You can reduce the number  of germs on your skin by washing with CHG (chlorahexidine gluconate) soap before surgery.  CHG is an antiseptic cleaner which kills germs and bonds with the skin to continue killing germs even after washing. Please DO NOT use if you have an allergy to CHG or antibacterial soaps.  If your skin becomes reddened/irritated stop using the CHG and inform your nurse when you arrive at Short Stay.  You may shave your face/neck. Please follow these instructions carefully:  1.  Shower with CHG Soap the night before surgery and the  morning of Surgery.  2.  If you choose to wash your hair, wash your hair first as usual with your  normal  shampoo.  3.  After you shampoo, rinse your hair and body thoroughly to remove the  shampoo.                            4.  Use CHG as you would any other liquid soap.  You can apply chg directly  to the skin and wash                       Gently with a scrungie or clean washcloth.  5.  Apply the CHG Soap to your body ONLY FROM THE NECK DOWN.   Do not use on face/ open                           Wound or open sores. Avoid contact with eyes, ears mouth and genitals (private parts).                       Wash face,  Genitals (private parts) with your normal soap.             6.  Wash thoroughly, paying special attention to the area where your surgery  will be performed.  7.  Thoroughly rinse your body with warm water from the neck down.  8.  DO NOT shower/wash with your normal soap after using and rinsing off  the CHG Soap.             9.  Pat yourself dry with a clean towel.            10.  Wear clean pajamas.            11.  Place clean sheets on your bed the night of your first shower and do not  sleep with pets. Day of Surgery : Do not apply any lotions/deodorants the morning of surgery.  Please wear clean clothes to the hospital/surgery center.  FAILURE TO FOLLOW THESE INSTRUCTIONS MAY RESULT IN THE CANCELLATION OF YOUR SURGERY   PATIENT  SIGNATURE_________________________________   ________________________________________________________________________  Micheal Lucas  An incentive spirometer is a tool that can help keep your lungs clear and active. This tool measures how well you are filling your lungs with each breath. Taking long deep breaths may help reverse or decrease the chance of developing breathing (pulmonary) problems (especially infection) following: A long period of time when you are unable to  move or be active. BEFORE THE PROCEDURE  If the spirometer includes an indicator to show your best effort, your nurse or respiratory therapist will set it to a desired goal. If possible, sit up straight or lean slightly forward. Try not to slouch. Hold the incentive spirometer in an upright position. INSTRUCTIONS FOR USE  Sit on the edge of your bed if possible, or sit up as far as you can in bed or on a chair. Hold the incentive spirometer in an upright position. Breathe out normally. Place the mouthpiece in your mouth and seal your lips tightly around it. Breathe in slowly and as deeply as possible, raising the piston or the ball toward the top of the column. Hold your breath for 3-5 seconds or for as long as possible. Allow the piston or ball to fall to the bottom of the column. Remove the mouthpiece from your mouth and breathe out normally. Rest for a few seconds and repeat Steps 1 through 7 at least 10 times every 1-2 hours when you are awake. Take your time and take a few normal breaths between deep breaths. The spirometer may include an indicator to show your best effort. Use the indicator as a goal to work toward during each repetition. After each set of 10 deep breaths, practice coughing to be sure your lungs are clear. If you have an incision (the cut made at the time of surgery), support your incision when coughing by placing a pillow or rolled up towels firmly against it. Once you are able to get out of  bed, walk around indoors and cough well. You may stop using the incentive spirometer when instructed by your caregiver.  RISKS AND COMPLICATIONS Take your time so you do not get dizzy or light-headed. If you are in pain, you may need to take or ask for pain medication before doing incentive spirometry. It is harder to take a deep breath if you are having pain. AFTER USE Rest and breathe slowly and easily. It can be helpful to keep track of a log of your progress. Your caregiver can provide you with a simple table to help with this. If you are using the spirometer at home, follow these instructions: [place] IF:  You are having difficultly using the spirometer. You have trouble using the spirometer as often as instructed. Your pain medication is not giving enough relief while using the spirometer. You develop fever of 100.5 F (38.1 C) or higher. SEEK IMMEDIATE MEDICAL CARE IF:  You cough up bloody sputum that had not been present before. You develop fever of 102 F (38.9 C) or greater. You develop worsening pain at or near the incision site. MAKE SURE YOU:  Understand these instructions. Will watch your condition. Will get help right away if you are not doing well or get worse. Document Released: 12/11/2006 Document Revised: 10/23/2011 Document Reviewed: 02/11/2007 [facility] Patient Information 2014 [place], [place].   ________________________________________________________________________

## 2022-10-06 ENCOUNTER — Encounter (HOSPITAL_COMMUNITY)
Admission: RE | Admit: 2022-10-06 | Discharge: 2022-10-06 | Disposition: A | Discharge status: Home / Self Care | Payer: BC Managed Care – PPO | Source: Ambulatory Visit | Attending: Orthopedic Surgery | Admitting: Orthopedic Surgery

## 2022-10-06 ENCOUNTER — Encounter (HOSPITAL_COMMUNITY): Payer: Self-pay

## 2022-10-06 ENCOUNTER — Other Ambulatory Visit: Payer: Self-pay

## 2022-10-06 VITALS — BP 151/80 | HR 71 | Temp 98.3°F | Resp 18 | Ht 74.0 in | Wt 219.0 lb

## 2022-10-06 DIAGNOSIS — I10 Essential (primary) hypertension: Secondary | ICD-10-CM | POA: Diagnosis not present

## 2022-10-06 DIAGNOSIS — Z01818 Encounter for other preprocedural examination: Secondary | ICD-10-CM | POA: Insufficient documentation

## 2022-10-06 LAB — CBC
HCT: 41.9 % (ref 39.0–52.0)
Hemoglobin: 13 g/dL (ref 13.0–17.0)
MCH: 23 pg — ABNORMAL LOW (ref 26.0–34.0)
MCHC: 31 g/dL (ref 30.0–36.0)
MCV: 74 fL — ABNORMAL LOW (ref 80.0–100.0)
Platelets: 219 10*3/uL (ref 150–400)
RBC: 5.66 MIL/uL (ref 4.22–5.81)
RDW: 16.1 % — ABNORMAL HIGH (ref 11.5–15.5)
WBC: 8.3 10*3/uL (ref 4.0–10.5)
nRBC: 0 % (ref 0.0–0.2)

## 2022-10-06 LAB — BASIC METABOLIC PANEL
Anion gap: 10 (ref 5–15)
BUN: 10 mg/dL (ref 8–23)
CO2: 25 mmol/L (ref 22–32)
Calcium: 9.2 mg/dL (ref 8.9–10.3)
Chloride: 101 mmol/L (ref 98–111)
Creatinine, Ser: 0.91 mg/dL (ref 0.61–1.24)
GFR, Estimated: >60 mL/min (ref >60)
Glucose, Bld: 102 mg/dL — ABNORMAL HIGH (ref 70–99)
Potassium: 3.5 mmol/L (ref 3.5–5.1)
Sodium: 136 mmol/L (ref 135–145)

## 2022-10-06 LAB — TYPE AND SCREEN
ABO/RH(D): O POS
Antibody Screen: NEGATIVE

## 2022-10-06 LAB — SURGICAL PCR SCREEN
MRSA, PCR: NEGATIVE
Staphylococcus aureus: NEGATIVE

## 2022-10-06 NOTE — Progress Notes (Signed)
Anesthesia note:   PCP - Dr. Coralie Keens Cardiologist -none Other-   Chest x-ray - no EKG - 10/06/22-chart Stress Test - no ECHO -no  Cardiac Cath - no CABG-no Pacemaker/ICD device last checked:NA  Sleep Study - no CPAP -   Pt is pre diabetic-no CBG at PAT visit- Fasting Blood Sugar at home- Checks Blood Sugar _____  Blood Thinner:no Blood Thinner Instructions: Aspirin Instructions: Last Dose:  Anesthesia review: /No    Patient denies shortness of breath, fever, cough and chest pain at PAT appointment.pt has no SOB with any activities.   Patient verbalized understanding of instructions that were given to them at the PAT appointment. Patient was also instructed that they will need to review over the PAT instructions again at home before surgery.yes

## 2022-10-18 ENCOUNTER — Encounter (HOSPITAL_COMMUNITY): Payer: Self-pay | Admitting: Orthopedic Surgery

## 2022-10-18 NOTE — Anesthesia Preprocedure Evaluation (Signed)
Anesthesia Evaluation  Patient identified by MRN, date of birth, ID band Patient awake    Reviewed: Allergy & Precautions, NPO status , Patient's Chart, lab work & pertinent test results, reviewed documented beta blocker date and time   Airway Mallampati: II       Dental no notable dental hx. (+) Teeth Intact, Dental Advisory Given   Pulmonary former smoker   Pulmonary exam normal breath sounds clear to auscultation       Cardiovascular hypertension, Pt. on home beta blockers and Pt. on medications Normal cardiovascular exam Rhythm:Regular Rate:Normal     Neuro/Psych negative neurological ROS  negative psych ROS   GI/Hepatic Neg liver ROS,GERD  ,,  Endo/Other  Hyperlipidemia  Renal/GU negative Renal ROS  negative genitourinary   Musculoskeletal  (+) Arthritis , Osteoarthritis,  Failed right TKR   Abdominal   Peds  Hematology  (+) Blood dyscrasia, anemia   Anesthesia Other Findings   Reproductive/Obstetrics                              Anesthesia Physical Anesthesia Plan  ASA: 2  Anesthesia Plan: Spinal   Post-op Pain Management: Minimal or no pain anticipated and Regional block*   Induction: Intravenous  PONV Risk Score and Plan: 2 and Treatment may vary due to age or medical condition, Propofol infusion and Ondansetron  Airway Management Planned: Natural Airway and Simple Face Mask  Additional Equipment:   Intra-op Plan:   Post-operative Plan:   Informed Consent: I have reviewed the patients History and Physical, chart, labs and discussed the procedure including the risks, benefits and alternatives for the proposed anesthesia with the patient or authorized representative who has indicated his/her understanding and acceptance.     Dental advisory given  Plan Discussed with: CRNA and Anesthesiologist  Anesthesia Plan Comments:          Anesthesia Quick  Evaluation

## 2022-10-19 ENCOUNTER — Encounter (HOSPITAL_COMMUNITY)
Admission: RE | Disposition: A | Discharge status: Home / Self Care | Payer: Self-pay | Source: Home / Self Care | Attending: Orthopedic Surgery

## 2022-10-19 ENCOUNTER — Other Ambulatory Visit: Payer: Self-pay

## 2022-10-19 ENCOUNTER — Ambulatory Visit (HOSPITAL_COMMUNITY): Payer: BC Managed Care – PPO | Admitting: Anesthesiology

## 2022-10-19 ENCOUNTER — Encounter (HOSPITAL_COMMUNITY): Payer: Self-pay | Admitting: Orthopedic Surgery

## 2022-10-19 ENCOUNTER — Observation Stay (HOSPITAL_COMMUNITY)
Admission: RE | Admit: 2022-10-19 | Discharge: 2022-10-20 | Disposition: A | Discharge status: Home / Self Care | Payer: BC Managed Care – PPO | Attending: Orthopedic Surgery | Admitting: Orthopedic Surgery

## 2022-10-19 DIAGNOSIS — Z96651 Presence of right artificial knee joint: Secondary | ICD-10-CM | POA: Diagnosis not present

## 2022-10-19 DIAGNOSIS — I10 Essential (primary) hypertension: Secondary | ICD-10-CM | POA: Diagnosis not present

## 2022-10-19 DIAGNOSIS — Y828 Other medical devices associated with adverse incidents: Secondary | ICD-10-CM | POA: Insufficient documentation

## 2022-10-19 DIAGNOSIS — T84032A Mechanical loosening of internal right knee prosthetic joint, initial encounter: Principal | ICD-10-CM | POA: Insufficient documentation

## 2022-10-19 DIAGNOSIS — Z87891 Personal history of nicotine dependence: Secondary | ICD-10-CM | POA: Diagnosis not present

## 2022-10-19 DIAGNOSIS — Z01818 Encounter for other preprocedural examination: Secondary | ICD-10-CM

## 2022-10-19 HISTORY — PX: TOTAL KNEE REVISION: SHX996

## 2022-10-19 SURGERY — TOTAL KNEE REVISION
Anesthesia: Spinal | Site: Knee | Laterality: Right

## 2022-10-19 MED ORDER — CELECOXIB 200 MG PO CAPS
200.0000 mg | ORAL_CAPSULE | Freq: Two times a day (BID) | ORAL | Status: DC
  Administered 2022-10-19 – 2022-10-20 (×3): 200 mg via ORAL
  Filled 2022-10-19 (×3): qty 1

## 2022-10-19 MED ORDER — PROPOFOL 500 MG/50ML IV EMUL
INTRAVENOUS | Status: DC | PRN
  Administered 2022-10-19: 75 ug/kg/min via INTRAVENOUS

## 2022-10-19 MED ORDER — PROPOFOL 1000 MG/100ML IV EMUL
INTRAVENOUS | Status: AC
  Filled 2022-10-19: qty 100

## 2022-10-19 MED ORDER — METHOCARBAMOL 500 MG PO TABS
500.0000 mg | ORAL_TABLET | Freq: Four times a day (QID) | ORAL | Status: DC | PRN
  Administered 2022-10-20 (×2): 500 mg via ORAL
  Filled 2022-10-19 (×2): qty 1

## 2022-10-19 MED ORDER — SODIUM CHLORIDE 0.9 % IR SOLN
Status: DC | PRN
  Administered 2022-10-19: 1000 mL

## 2022-10-19 MED ORDER — POVIDONE-IODINE 10 % EX SWAB: 2.0000 | Freq: Once | CUTANEOUS | Status: DC

## 2022-10-19 MED ORDER — SODIUM CHLORIDE 0.9 % IV SOLN: INTRAVENOUS | Status: DC

## 2022-10-19 MED ORDER — OXYCODONE HCL 5 MG PO TABS
5.0000 mg | ORAL_TABLET | ORAL | Status: DC | PRN
  Administered 2022-10-20 (×2): 10 mg via ORAL
  Filled 2022-10-19 (×2): qty 2

## 2022-10-19 MED ORDER — ONDANSETRON HCL 4 MG PO TABS: 4.0000 mg | ORAL_TABLET | Freq: Four times a day (QID) | ORAL | Status: DC | PRN

## 2022-10-19 MED ORDER — ORAL CARE MOUTH RINSE: 15.0000 mL | Freq: Once | OROMUCOSAL | Status: AC

## 2022-10-19 MED ORDER — DEXAMETHASONE SODIUM PHOSPHATE 10 MG/ML IJ SOLN
INTRAMUSCULAR | Status: AC
  Filled 2022-10-19: qty 2

## 2022-10-19 MED ORDER — ONDANSETRON HCL 4 MG/2ML IJ SOLN
INTRAMUSCULAR | Status: AC
  Filled 2022-10-19: qty 6

## 2022-10-19 MED ORDER — PROPOFOL 500 MG/50ML IV EMUL
INTRAVENOUS | Status: AC
  Filled 2022-10-19: qty 50

## 2022-10-19 MED ORDER — STERILE WATER FOR IRRIGATION IR SOLN
Status: DC | PRN
  Administered 2022-10-19: 2000 mL

## 2022-10-19 MED ORDER — OXYCODONE HCL 5 MG PO TABS
10.0000 mg | ORAL_TABLET | ORAL | Status: DC | PRN
  Administered 2022-10-19 – 2022-10-20 (×2): 10 mg via ORAL
  Filled 2022-10-19 (×2): qty 2

## 2022-10-19 MED ORDER — DIPHENHYDRAMINE HCL 12.5 MG/5ML PO ELIX: 12.5000 mg | ORAL_SOLUTION | ORAL | Status: DC | PRN

## 2022-10-19 MED ORDER — LACTATED RINGERS IV SOLN: INTRAVENOUS | Status: DC

## 2022-10-19 MED ORDER — BUPIVACAINE IN DEXTROSE 0.75-8.25 % IT SOLN
INTRATHECAL | Status: DC | PRN
  Administered 2022-10-19: 2 mL via INTRATHECAL

## 2022-10-19 MED ORDER — PRAVASTATIN SODIUM 20 MG PO TABS: 40.0000 mg | ORAL_TABLET | Freq: Every evening | ORAL | Status: DC

## 2022-10-19 MED ORDER — DOCUSATE SODIUM 100 MG PO CAPS
100.0000 mg | ORAL_CAPSULE | Freq: Two times a day (BID) | ORAL | Status: DC
  Administered 2022-10-19 – 2022-10-20 (×3): 100 mg via ORAL
  Filled 2022-10-19 (×3): qty 1

## 2022-10-19 MED ORDER — KETOROLAC TROMETHAMINE 30 MG/ML IJ SOLN
INTRAMUSCULAR | Status: DC | PRN
  Administered 2022-10-19: 30 mg

## 2022-10-19 MED ORDER — KETOROLAC TROMETHAMINE 30 MG/ML IJ SOLN
INTRAMUSCULAR | Status: AC
  Filled 2022-10-19: qty 1

## 2022-10-19 MED ORDER — ALBUMIN HUMAN 5 % IV SOLN
INTRAVENOUS | Status: AC
  Filled 2022-10-19: qty 250

## 2022-10-19 MED ORDER — PROPOFOL 10 MG/ML IV BOLUS
INTRAVENOUS | Status: DC | PRN
  Administered 2022-10-19: 10 mg via INTRAVENOUS
  Administered 2022-10-19 (×2): 20 mg via INTRAVENOUS

## 2022-10-19 MED ORDER — 0.9 % SODIUM CHLORIDE (POUR BTL) OPTIME
TOPICAL | Status: DC | PRN
  Administered 2022-10-19: 1000 mL

## 2022-10-19 MED ORDER — PANTOPRAZOLE SODIUM 40 MG PO TBEC
40.0000 mg | DELAYED_RELEASE_TABLET | Freq: Every day | ORAL | Status: DC
  Administered 2022-10-20: 40 mg via ORAL
  Filled 2022-10-19: qty 1

## 2022-10-19 MED ORDER — DEXAMETHASONE SODIUM PHOSPHATE 10 MG/ML IJ SOLN
INTRAMUSCULAR | Status: DC | PRN
  Administered 2022-10-19: 10 mg via INTRAVENOUS

## 2022-10-19 MED ORDER — TIMOLOL MALEATE 0.5 % OP SOLN
1.0000 [drp] | Freq: Every morning | OPHTHALMIC | Status: DC
  Administered 2022-10-20: 1 [drp] via OPHTHALMIC
  Filled 2022-10-19: qty 5

## 2022-10-19 MED ORDER — OXYCODONE HCL 5 MG/5ML PO SOLN: 5.0000 mg | Freq: Once | ORAL | Status: AC | PRN

## 2022-10-19 MED ORDER — SODIUM CHLORIDE (PF) 0.9 % IJ SOLN
INTRAMUSCULAR | Status: DC | PRN
  Administered 2022-10-19: 30 mL

## 2022-10-19 MED ORDER — HYDROMORPHONE HCL 1 MG/ML IJ SOLN
INTRAMUSCULAR | Status: AC
  Filled 2022-10-19: qty 1

## 2022-10-19 MED ORDER — BISACODYL 10 MG RE SUPP: 10.0000 mg | Freq: Every day | RECTAL | Status: DC | PRN

## 2022-10-19 MED ORDER — ACETAMINOPHEN 325 MG PO TABS: 325.0000 mg | ORAL_TABLET | Freq: Four times a day (QID) | ORAL | Status: DC | PRN

## 2022-10-19 MED ORDER — HYDROMORPHONE HCL 1 MG/ML IJ SOLN
0.5000 mg | INTRAMUSCULAR | Status: DC | PRN
  Administered 2022-10-19: 1 mg via INTRAVENOUS
  Filled 2022-10-19: qty 1

## 2022-10-19 MED ORDER — CARVEDILOL 3.125 MG PO TABS
3.1250 mg | ORAL_TABLET | Freq: Two times a day (BID) | ORAL | Status: DC
  Administered 2022-10-19 – 2022-10-20 (×2): 3.125 mg via ORAL
  Filled 2022-10-19 (×2): qty 1

## 2022-10-19 MED ORDER — HYDROMORPHONE HCL 1 MG/ML IJ SOLN
0.2500 mg | INTRAMUSCULAR | Status: DC | PRN
  Administered 2022-10-19 (×4): 0.5 mg via INTRAVENOUS

## 2022-10-19 MED ORDER — METHOCARBAMOL 500 MG IVPB - SIMPLE MED
500.0000 mg | Freq: Four times a day (QID) | INTRAVENOUS | Status: DC | PRN
  Administered 2022-10-19: 500 mg via INTRAVENOUS

## 2022-10-19 MED ORDER — OXYCODONE HCL 5 MG PO TABS
ORAL_TABLET | ORAL | Status: AC
  Filled 2022-10-19: qty 1

## 2022-10-19 MED ORDER — ACETAMINOPHEN 500 MG PO TABS
1000.0000 mg | ORAL_TABLET | Freq: Four times a day (QID) | ORAL | Status: AC
  Administered 2022-10-19 – 2022-10-20 (×4): 1000 mg via ORAL
  Filled 2022-10-19 (×4): qty 2

## 2022-10-19 MED ORDER — BUPIVACAINE-EPINEPHRINE 0.25% -1:200000 IJ SOLN
INTRAMUSCULAR | Status: DC | PRN
  Administered 2022-10-19: 30 mL

## 2022-10-19 MED ORDER — PHENYLEPHRINE HCL-NACL 20-0.9 MG/250ML-% IV SOLN
INTRAVENOUS | Status: DC | PRN
  Administered 2022-10-19: 15 ug/min via INTRAVENOUS

## 2022-10-19 MED ORDER — BUPIVACAINE-EPINEPHRINE (PF) 0.25% -1:200000 IJ SOLN
INTRAMUSCULAR | Status: AC
  Filled 2022-10-19: qty 30

## 2022-10-19 MED ORDER — POLYETHYLENE GLYCOL 3350 17 G PO PACK
17.0000 g | PACK | Freq: Two times a day (BID) | ORAL | Status: DC
  Administered 2022-10-19 – 2022-10-20 (×2): 17 g via ORAL
  Filled 2022-10-19 (×2): qty 1

## 2022-10-19 MED ORDER — KETAMINE HCL 10 MG/ML IJ SOLN
INTRAMUSCULAR | Status: DC | PRN
  Administered 2022-10-19: 20 mg via INTRAVENOUS
  Administered 2022-10-19: 10 mg via INTRAVENOUS

## 2022-10-19 MED ORDER — MENTHOL 3 MG MT LOZG: 1.0000 | LOZENGE | OROMUCOSAL | Status: DC | PRN

## 2022-10-19 MED ORDER — DEXAMETHASONE SODIUM PHOSPHATE 10 MG/ML IJ SOLN: 8.0000 mg | Freq: Once | INTRAMUSCULAR | Status: DC

## 2022-10-19 MED ORDER — ONDANSETRON HCL 4 MG/2ML IJ SOLN
INTRAMUSCULAR | Status: DC | PRN
  Administered 2022-10-19: 4 mg via INTRAVENOUS

## 2022-10-19 MED ORDER — PHENOL 1.4 % MT LIQD: 1.0000 | OROMUCOSAL | Status: DC | PRN

## 2022-10-19 MED ORDER — METOCLOPRAMIDE HCL 5 MG/ML IJ SOLN
5.0000 mg | Freq: Three times a day (TID) | INTRAMUSCULAR | Status: DC | PRN
  Administered 2022-10-19: 10 mg via INTRAVENOUS
  Filled 2022-10-19: qty 2

## 2022-10-19 MED ORDER — METOCLOPRAMIDE HCL 5 MG PO TABS: 5.0000 mg | ORAL_TABLET | Freq: Three times a day (TID) | ORAL | Status: DC | PRN

## 2022-10-19 MED ORDER — GABAPENTIN 300 MG PO CAPS
300.0000 mg | ORAL_CAPSULE | Freq: Every day | ORAL | Status: DC
  Administered 2022-10-19 – 2022-10-20 (×2): 300 mg via ORAL
  Filled 2022-10-19 (×2): qty 1

## 2022-10-19 MED ORDER — CHLORHEXIDINE GLUCONATE 0.12 % MT SOLN
15.0000 mL | Freq: Once | OROMUCOSAL | Status: AC
  Administered 2022-10-19: 15 mL via OROMUCOSAL

## 2022-10-19 MED ORDER — ONDANSETRON HCL 4 MG/2ML IJ SOLN
4.0000 mg | Freq: Four times a day (QID) | INTRAMUSCULAR | Status: DC | PRN
  Administered 2022-10-19: 4 mg via INTRAVENOUS
  Filled 2022-10-19: qty 2

## 2022-10-19 MED ORDER — CEFAZOLIN SODIUM-DEXTROSE 2-4 GM/100ML-% IV SOLN
2.0000 g | Freq: Four times a day (QID) | INTRAVENOUS | Status: AC
  Administered 2022-10-19 (×2): 2 g via INTRAVENOUS
  Filled 2022-10-19 (×2): qty 100

## 2022-10-19 MED ORDER — FENTANYL CITRATE PF 50 MCG/ML IJ SOSY
50.0000 ug | PREFILLED_SYRINGE | INTRAMUSCULAR | Status: AC
  Administered 2022-10-19: 50 ug via INTRAVENOUS
  Filled 2022-10-19: qty 2

## 2022-10-19 MED ORDER — TRANEXAMIC ACID-NACL 1000-0.7 MG/100ML-% IV SOLN
1000.0000 mg | Freq: Once | INTRAVENOUS | Status: AC
  Administered 2022-10-19: 1000 mg via INTRAVENOUS
  Filled 2022-10-19: qty 100

## 2022-10-19 MED ORDER — AMLODIPINE BESYLATE 5 MG PO TABS
2.5000 mg | ORAL_TABLET | Freq: Every day | ORAL | Status: DC
  Administered 2022-10-19 – 2022-10-20 (×2): 2.5 mg via ORAL
  Filled 2022-10-19 (×2): qty 1

## 2022-10-19 MED ORDER — METHOCARBAMOL 500 MG IVPB - SIMPLE MED
INTRAVENOUS | Status: AC
  Filled 2022-10-19: qty 55

## 2022-10-19 MED ORDER — ONDANSETRON HCL 4 MG/2ML IJ SOLN: 4.0000 mg | Freq: Once | INTRAMUSCULAR | Status: DC | PRN

## 2022-10-19 MED ORDER — CEFAZOLIN SODIUM-DEXTROSE 2-4 GM/100ML-% IV SOLN
2.0000 g | INTRAVENOUS | Status: AC
  Administered 2022-10-19: 2 g via INTRAVENOUS
  Filled 2022-10-19: qty 100

## 2022-10-19 MED ORDER — ASPIRIN 81 MG PO CHEW
81.0000 mg | CHEWABLE_TABLET | Freq: Two times a day (BID) | ORAL | Status: DC
  Administered 2022-10-19 – 2022-10-20 (×2): 81 mg via ORAL
  Filled 2022-10-19 (×2): qty 1

## 2022-10-19 MED ORDER — OXYCODONE HCL 5 MG PO TABS
5.0000 mg | ORAL_TABLET | Freq: Once | ORAL | Status: AC | PRN
  Administered 2022-10-19: 5 mg via ORAL

## 2022-10-19 MED ORDER — MIDAZOLAM HCL 2 MG/2ML IJ SOLN
1.0000 mg | INTRAMUSCULAR | Status: AC
  Administered 2022-10-19: 1 mg via INTRAVENOUS
  Filled 2022-10-19: qty 2

## 2022-10-19 MED ORDER — KETAMINE HCL 50 MG/5ML IJ SOSY
PREFILLED_SYRINGE | INTRAMUSCULAR | Status: AC
  Filled 2022-10-19: qty 5

## 2022-10-19 MED ORDER — TERAZOSIN HCL 1 MG PO CAPS
1.0000 mg | ORAL_CAPSULE | Freq: Every day | ORAL | Status: DC
  Administered 2022-10-19: 1 mg via ORAL
  Filled 2022-10-19: qty 1

## 2022-10-19 MED ORDER — SODIUM CHLORIDE (PF) 0.9 % IJ SOLN
INTRAMUSCULAR | Status: AC
  Filled 2022-10-19: qty 30

## 2022-10-19 MED ORDER — TRANEXAMIC ACID-NACL 1000-0.7 MG/100ML-% IV SOLN
1000.0000 mg | INTRAVENOUS | Status: AC
  Administered 2022-10-19: 1000 mg via INTRAVENOUS
  Filled 2022-10-19: qty 100

## 2022-10-19 MED ORDER — ALBUMIN HUMAN 5 % IV SOLN: INTRAVENOUS | Status: DC | PRN

## 2022-10-19 MED ORDER — DEXAMETHASONE SODIUM PHOSPHATE 10 MG/ML IJ SOLN
10.0000 mg | Freq: Once | INTRAMUSCULAR | Status: AC
  Administered 2022-10-20: 10 mg via INTRAVENOUS
  Filled 2022-10-19: qty 1

## 2022-10-19 MED ORDER — LATANOPROST 0.005 % OP SOLN
1.0000 [drp] | Freq: Every day | OPHTHALMIC | Status: DC
  Administered 2022-10-19: 1 [drp] via OPHTHALMIC
  Filled 2022-10-19 (×2): qty 2.5

## 2022-10-19 SURGICAL SUPPLY — 75 items
ADH SKN CLS APL DERMABOND .7 (GAUZE/BANDAGES/DRESSINGS) ×1
AUG FEM SZ6 4 REV POST STRL LF (Miscellaneous) ×1 IMPLANT
AUG FEM SZ6 8 REV DIST STRL LF (Miscellaneous) ×2 IMPLANT
AUGMENT DISTAL FEM SZ6 8 KNEE (Miscellaneous) IMPLANT
AUGMENT POST FEM SZ6 4 KNEE (Miscellaneous) IMPLANT
BAG COUNTER SPONGE SURGICOUNT (BAG) IMPLANT
BAG DECANTER FOR FLEXI CONT (MISCELLANEOUS) IMPLANT
BAG SPEC THK2 15X12 ZIP CLS (MISCELLANEOUS)
BAG SPNG CNTER NS LX DISP (BAG)
BAG ZIPLOCK 12X15 (MISCELLANEOUS) IMPLANT
BASE TIB KNEE REV RP ATUNE SZ6 (Knees) IMPLANT
BLADE SAW SGTL 11.0X1.19X90.0M (BLADE) IMPLANT
BLADE SAW SGTL 13.0X1.19X90.0M (BLADE) ×1 IMPLANT
BLADE SAW SGTL 81X20 HD (BLADE) ×1 IMPLANT
BLADE SURG SZ10 CARB STEEL (BLADE) ×2 IMPLANT
BNDG CMPR 5X62 HK CLSR LF (GAUZE/BANDAGES/DRESSINGS) ×1
BNDG CMPR MED 10X6 ELC LF (GAUZE/BANDAGES/DRESSINGS) ×1
BNDG ELASTIC 6INX 5YD STR LF (GAUZE/BANDAGES/DRESSINGS) ×1 IMPLANT
BNDG ELASTIC 6X10 VLCR STRL LF (GAUZE/BANDAGES/DRESSINGS) IMPLANT
BNDG ELASTIC 6X5.8 VLCR STR LF (GAUZE/BANDAGES/DRESSINGS) IMPLANT
BRUSH FEMORAL CANAL (MISCELLANEOUS) IMPLANT
BSPLAT TIB 6 CMNT REV ROT PLAT (Knees) ×1 IMPLANT
CEMENT HV SMART SET (Cement) IMPLANT
COVER SURGICAL LIGHT HANDLE (MISCELLANEOUS) ×1 IMPLANT
CUFF TOURN SGL QUICK 34 (TOURNIQUET CUFF) ×1
CUFF TRNQT CYL 34X4.125X (TOURNIQUET CUFF) ×1 IMPLANT
DERMABOND ADVANCED .7 DNX12 (GAUZE/BANDAGES/DRESSINGS) ×1 IMPLANT
DRAPE INCISE IOBAN 66X45 STRL (DRAPES) ×1 IMPLANT
DRAPE U-SHAPE 47X51 STRL (DRAPES) ×1 IMPLANT
DRESSING AQUACEL AG SP 3.5X10 (GAUZE/BANDAGES/DRESSINGS) IMPLANT
DRSG AQUACEL AG ADV 3.5X10 (GAUZE/BANDAGES/DRESSINGS) ×1 IMPLANT
DRSG AQUACEL AG ADV 3.5X14 (GAUZE/BANDAGES/DRESSINGS) IMPLANT
DRSG AQUACEL AG SP 3.5X10 (GAUZE/BANDAGES/DRESSINGS)
DURAPREP 26ML APPLICATOR (WOUND CARE) ×2 IMPLANT
ELECT REM PT RETURN 15FT ADLT (MISCELLANEOUS) ×1 IMPLANT
FEM KNEE ATTUNE REV CRS SZ6 RT (Orthopedic Implant) ×1 IMPLANT
FEMORAL KNE ATTN REV CRS SZ6RT (Orthopedic Implant) IMPLANT
GAUZE SPONGE 2X2 STRL 8-PLY (GAUZE/BANDAGES/DRESSINGS) IMPLANT
GLOVE BIO SURGEON STRL SZ 6 (GLOVE) ×1 IMPLANT
GLOVE BIOGEL PI IND STRL 6.5 (GLOVE) ×1 IMPLANT
GLOVE BIOGEL PI IND STRL 7.5 (GLOVE) ×1 IMPLANT
GLOVE ORTHO TXT STRL SZ7.5 (GLOVE) ×2 IMPLANT
GOWN STRL REUS W/ TWL LRG LVL3 (GOWN DISPOSABLE) ×2 IMPLANT
GOWN STRL REUS W/TWL LRG LVL3 (GOWN DISPOSABLE) ×2
HANDPIECE INTERPULSE COAX TIP (DISPOSABLE) ×1
HOLDER FOLEY CATH W/STRAP (MISCELLANEOUS) IMPLANT
INSERT TIB CRS ATTUNE SZ6 10 (Insert) IMPLANT
KIT TURNOVER KIT A (KITS) IMPLANT
MANIFOLD NEPTUNE II (INSTRUMENTS) ×1 IMPLANT
NDL SAFETY ECLIP 18X1.5 (MISCELLANEOUS) IMPLANT
NS IRRIG 1000ML POUR BTL (IV SOLUTION) ×1 IMPLANT
PACK TOTAL KNEE CUSTOM (KITS) ×1 IMPLANT
PIN FIX SIGMA LCS THRD HI (PIN) IMPLANT
PROTECTOR NERVE ULNAR (MISCELLANEOUS) ×1 IMPLANT
RESTRICTOR CEMENT SZ 5 C-STEM (Cement) IMPLANT
SET HNDPC FAN SPRY TIP SCT (DISPOSABLE) ×1 IMPLANT
SET PAD KNEE POSITIONER (MISCELLANEOUS) ×1 IMPLANT
SLEEVE TIB ATTUNE FP 37 (Knees) IMPLANT
SOLUTION IRRIG SURGIPHOR (IV SOLUTION) IMPLANT
SPIKE FLUID TRANSFER (MISCELLANEOUS) IMPLANT
STAPLER VISISTAT 35W (STAPLE) IMPLANT
STEM CMT REV 14X130 (Knees) IMPLANT
STEM STR ATTUNE PF 18X60 (Knees) IMPLANT
SUT MNCRL AB 3-0 PS2 18 (SUTURE) ×1 IMPLANT
SUT STRATAFIX PDS+ 0 24IN (SUTURE) ×1 IMPLANT
SUT VIC AB 1 CT1 36 (SUTURE) ×1 IMPLANT
SUT VIC AB 2-0 CT1 27 (SUTURE) ×3
SUT VIC AB 2-0 CT1 TAPERPNT 27 (SUTURE) ×3 IMPLANT
SYR 50ML LL SCALE MARK (SYRINGE) IMPLANT
TOWER CARTRIDGE SMART MIX (DISPOSABLE) ×1 IMPLANT
TRAY FOLEY MTR SLVR 16FR STAT (SET/KITS/TRAYS/PACK) ×1 IMPLANT
TUBE KAMVAC SUCTION (TUBING) IMPLANT
TUBE SUCTION HIGH CAP CLEAR NV (SUCTIONS) ×1 IMPLANT
WATER STERILE IRR 1000ML POUR (IV SOLUTION) ×1 IMPLANT
WRAP KNEE MAXI GEL POST OP (GAUZE/BANDAGES/DRESSINGS) ×1 IMPLANT

## 2022-10-19 NOTE — Anesthesia Postprocedure Evaluation (Signed)
Anesthesia Post Note  Patient: Micheal Lucas  Procedure(s) Performed: TOTAL KNEE REVISION (Right: Knee)     Patient location during evaluation: PACU Anesthesia Type: Spinal Level of consciousness: oriented and awake and alert Pain management: pain level controlled Vital Signs Assessment: post-procedure vital signs reviewed and stable Respiratory status: spontaneous breathing, respiratory function stable and nonlabored ventilation Cardiovascular status: blood pressure returned to baseline and stable Postop Assessment: no headache, no backache, no apparent nausea or vomiting, spinal receding and patient able to bend at knees Anesthetic complications: no   No notable events documented.  Last Vitals:  Vitals:   10/19/22 1219 10/19/22 1224  BP:    Pulse: 71 71  Resp: 12 12  Temp:    SpO2: 99% 99%    Last Pain:  Vitals:   10/19/22 1224  PainSc: 6                  Weltha Cathy A.

## 2022-10-19 NOTE — Anesthesia Procedure Notes (Signed)
Spinal  Patient location during procedure: OR Start time: 10/19/2022 9:25 AM Reason for block: surgical anesthesia Staffing Performed by: Timoteo Expose, CRNA Authorized by: Josephine Igo, MD   Preanesthetic Checklist Completed: patient identified, IV checked, site marked, risks and benefits discussed, surgical consent, monitors and equipment checked, pre-op evaluation and timeout performed Spinal Block Patient position: sitting Prep: DuraPrep Patient monitoring: heart rate, cardiac monitor, continuous pulse ox and blood pressure Approach: midline Location: L3-4 Injection technique: single-shot Needle Needle type: Pencan  Needle gauge: 24 G Needle length: 10 cm Assessment Sensory level: T10 Events: CSF return

## 2022-10-19 NOTE — H&P (Signed)
TOTAL KNEE REVISION ADMISSION H&P  Patient is being admitted for right revision total knee arthroplasty.  Subjective:  Chief Complaint:right knee pain, recurrent effusions  HPI: Micheal Lucas, 67 y.o. male. He has a history of right total knee arthroplasty in 2017 by Dr. Alvan Dame. He has had recurrent effusions over the past several years, which have been sent for [facility] multiple times and showed no signs of infection. His knee has become painful over the past year.  Patient Active Problem List   Diagnosis Date Noted   Obese 05/24/2016   S/P right TKA 05/23/2016   S/P knee replacement 05/23/2016   Past Medical History:  Diagnosis Date   Anemia    Arthritis    oa   Bruit of right carotid artery    Cataract    right eye   Elevated cholesterol    GERD (gastroesophageal reflux disease)    Hypertension    Plantar fascial fibromatosis     Past Surgical History:  Procedure Laterality Date   colonscopy  age 47   EYE SURGERY Left 04/10/2016   cataract surgery   EYE SURGERY Bilateral 2015   stents   knee scope and cartlidge implant Left 2009   TOTAL KNEE ARTHROPLASTY Right 05/23/2016   Procedure: RIGHT TOTAL KNEE ARTHROPLASTY;  Surgeon: Paralee Cancel, MD;  Location: [facility];  Service: Orthopedics;  Laterality: Right;  Adductor block    Current Facility-Administered Medications  Medication Dose Route Frequency Provider Last Rate Last Admin   ceFAZolin (ANCEF) IVPB 2g/100 mL premix  2 g Intravenous On Call to OR Irving Copas, PA-C       dexamethasone (DECADRON) injection 8 mg  8 mg Intravenous Once Irving Copas, PA-C       fentaNYL (SUBLIMAZE) injection 50-100 mcg  50-100 mcg Intravenous Earlene Plater, MD       lactated ringers infusion   Intravenous Continuous Irving Copas, PA-C       lactated ringers infusion   Intravenous Continuous Nolon Nations, MD 10 mL/hr at 10/19/22 0645 New Bag at 10/19/22 0645   midazolam (VERSED) injection 1-2 mg  1-2 mg  Intravenous Earlene Plater, MD       povidone-iodine 10 % swab 2 Application  2 Application Topical Once Irving Copas, PA-C       tranexamic acid (CYKLOKAPRON) IVPB 1,000 mg  1,000 mg Intravenous To OR Irving Copas, PA-C       Allergies  Allergen Reactions   Morphine Rash    Social History   Tobacco Use   Smoking status: Former    Packs/day: 0.25    Years: 10.00    Total pack years: 2.50    Types: Cigarettes    Quit date: 08/14/1997    Years since quitting: 25.1   Smokeless tobacco: Never  Substance Use Topics   Alcohol use: Yes    Comment: occ wine    History reviewed. No pertinent family history.    Review of Systems  Constitutional:  Negative for chills and fever.  Respiratory:  Negative for cough and shortness of breath.   Cardiovascular:  Negative for chest pain.  Gastrointestinal:  Negative for nausea and vomiting.  Musculoskeletal:  Positive for arthralgias.      Objective:  Physical Exam Well nourished and well developed. General: Alert and oriented x3, cooperative and pleasant, no acute distress. Head: normocephalic, atraumatic, neck supple. Eyes: EOMI.  Musculoskeletal: Right knee exam: Large effusion noted without warmth or erythema Full knee extension  with perhaps slight hyperextension with flexion limited over 100 degrees due to the tightness related to the swelling Mild lower extremity edema without erythema   Calves soft and nontender. Motor function intact in LE. Strength 5/5 LE bilaterally. Neuro: Distal pulses 2+. Sensation to light touch intact in LE.  Vital signs in last 24 hours: Weight:  [99.3 kg] 99.3 kg (03/07 0640)  Labs:  Estimated body mass index is 28.12 kg/m as calculated from the following:   Height as of this encounter: '6\' 2"'$  (1.88 m).   Weight as of this encounter: 99.3 kg.  Imaging Review Imaging: Today I reviewed the radiographs of his right knee from July 2023. These radiographs do indicate some concerns  for aseptic loosening particular of the tibial tray. No evidence of acute pathology.    Assessment/Plan:  , right knee(s) with failed previous arthroplasty.   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 Revision total knee arthroplasty is deemed medically necessary. The treatment options including medical management, injection therapy, arthroscopy and revision arthroplasty were discussed at length. The risks and benefits of revision total knee arthroplasty were presented and reviewed. The risks due to aseptic loosening, infection, stiffness, patella tracking problems, thromboembolic complications and other imponderables were discussed. The patient acknowledged the explanation, agreed to proceed with the plan and consent was signed. Patient is being admitted for inpatient treatment for surgery, pain control, PT, OT, prophylactic antibiotics, VTE prophylaxis, progressive ambulation and ADL's and discharge planning.The patient is planning to be discharged  home.  Therapy Plans: outpatient therapy at [facility] Disposition: Home with wife Planned DVT Prophylaxis: aspirin '81mg'$  BID DME needed: walker PCP: Glynn Octave, clearance received TXA: IV Allergies: morphine - rash Anesthesia Concerns: none - did well with spinal previously BMI: 30.5 Last HgbA1c: not diabetic   Other:  - No hx of VTE or cancer - oxycodone, robaxin, tylenol, celebrex - Wife recently had TKA - doing well and will be able to take him to PT appointments, etc    Costella Hatcher, PA-C Orthopedic Surgery [facility] [place] [phone]

## 2022-10-19 NOTE — Brief Op Note (Signed)
10/19/2022  8:46 AM  PATIENT:  Jackquline Berlin  67 y.o. 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   Delay start of Pharmacological VTE agent (>24hrs) due to surgical blood loss or risk of bleeding: no

## 2022-10-19 NOTE — Discharge Instructions (Signed)

## 2022-10-19 NOTE — Transfer of Care (Signed)
Immediate Anesthesia Transfer of Care Note  Patient: Chade Gotcher  Procedure(s) Performed: TOTAL KNEE REVISION (Right: Knee)  Patient Location: PACU  Anesthesia Type:Spinal  Level of Consciousness: awake, alert , and patient cooperative  Airway & Oxygen Therapy: Patient Spontanous Breathing and Patient connected to face mask oxygen  Post-op Assessment: Report given to RN and Post -op Vital signs reviewed and stable  Post vital signs: Reviewed and stable  Last Vitals:  Vitals Value Taken Time  BP 130/69 10/19/22 1118  Temp 36.2 C 10/19/22 1120  Pulse 78 10/19/22 1120  Resp 12 10/19/22 1120  SpO2 100 % 10/19/22 1120  Vitals shown include unvalidated device data.  Last Pain:  Vitals:   10/19/22 0640  PainSc: 6       Patients Stated Pain Goal: 5 ([id number])  Complications: No notable events documented.

## 2022-10-19 NOTE — Anesthesia Procedure Notes (Signed)
Anesthesia Regional Block: Adductor canal block   Pre-Anesthetic Checklist: , timeout performed,  Correct Patient, Correct Site, Correct Laterality,  Correct Procedure, Correct Position, site marked,  Risks and benefits discussed,  Surgical consent,  Pre-op evaluation,  At surgeon's request and post-op pain management  Laterality: Right  Prep: chloraprep       Needles:  Injection technique: Single-shot  Needle Type: Echogenic Stimulator Needle     Needle Length: 10cm  Needle Gauge: 21   Needle insertion depth: 7 cm   Additional Needles:   Procedures:,,,, ultrasound used (permanent image in chart),,    Narrative:  Start time: 10/19/2022 7:53 AM End time: 10/19/2022 7:58 AM Injection made incrementally with aspirations every 5 mL.  Performed by: Personally  Anesthesiologist: Josephine Igo, MD  Additional Notes: Timeout performed. Patient sedated. Relevant anatomy ID'd using [place]. Incremental 2-23m injection of LA with frequent aspiration. Patient tolerated procedure well.     Right Adductor Canal Block

## 2022-10-19 NOTE — Op Note (Signed)
NAME: Micheal Lucas, LANIGAN MEDICAL RECORD NO: [id number] ACCOUNT NO: [id number] DATE OF BIRTH: 28-Mar-1956 FACILITY: [facility] LOCATION: [facility] PHYSICIAN: Pietro Cassis. Alvan Dame, MD  Operative Report   DATE OF PROCEDURE: 10/19/2022  PREOPERATIVE DIAGNOSIS:  Aseptic failure, right total knee arthroplasty.  POSTOPERATIVE DIAGNOSIS:  Aseptic failure, right total knee arthroplasty.  PROCEDURE:  Revision right total knee arthroplasty.  COMPONENTS USED:  DePuy Attune knee revision system with a size right 6 revision femoral component with a 4 mm posterior medial augment used 8 mm distal medial and lateral augments with a 14 x 130 mm cemented stem.  We used a size 6 revision tibial tray  with a 37 mm fully porous coated sleeve with an 18 x 60 mm press-fit stem.  We used a size 10 mm CRS rotating platform insert.  SURGEON:  Pietro Cassis. Alvan Dame, MD  ASSISTANT:  Costella Hatcher, PA-C.  Note that Ms. Lu Duffel was present for the entirety of the case from preoperative positioning, perioperative management of the operative extremity, general facilitation of the case and primary wound closure.  ANESTHESIA:  Regional plus spinal block.  TOURNIQUET:  Up for 75 minutes at 225 mmHg.  BLOOD LOSS:  Approximately 300 mL.  DRAINS:  None.  COMPLICATIONS:  None apparent.  INDICATIONS FOR PROCEDURE:  The patient is a very pleasant 67 year old male with a history of index right total knee arthroplasty from 2017.  We have been following him over the past couple years with recurrent effusions to his right knee.  His  differential diagnosis included some instability versus loosening.  Over the past 6-9 months he began having increasing pain, limiting his functional quality of life and ability to tolerate recurrent effusions.  Based on his radiographic appearance and  the recurrent effusions we rule out infection by aspiration as well as laboratory studies.  Based on the effect on his quality of life and function we have  discussed proceeding with revision total knee arthroplasty.  The risks of recurrent instability,  recurrent loosening of the components for any mechanism including aseptic failure as well as cement bone interface including risks of DVT, stiffness and need for future surgeries.  Consent was obtained for the benefit of pain relief.  DESCRIPTION OF PROCEDURE:  The patient was brought to the operative theater.  Once adequate anesthesia, preoperative antibiotics, Ancef administered as well as tranexamic acid and Decadron, he was positioned supine with a right thigh tourniquet placed.   The right lower extremity was prepped and draped in sterile fashion.  A timeout was performed identifying the patient, planned procedure, and extremity.  His right lower extremity was exsanguinated and tourniquet elevated to 225 mmHg.  His old incision  was marked and excised and extended slightly proximal and distal for exposure for revision purposes.  Soft tissue planes created.  Median arthrotomy was made encountering a large clear joint fluid effusion.  The initial portion of the procedure was  exposure.  I recreated the medial and lateral and suprapatellar aspect of the joint with a complete synovectomy.  This allowed for proximal medial peel off the proximal tibia as well as exposure laterally.  I everted the patella and debrided all the soft  tissue and synovium around this area.  Once this was done, we did identify the tibial component was obviously loose.  I further worked around this over the tibia subluxated to loosen it further.  I then focused attention on the femur.  After debriding  soft tissue along the rim of the component identified significant  loss of bone in the anterolateral aspect of the flange, region of the femoral component.  We then used a thin ACL saw and undermined bone cement interface and the femoral component was  removed without difficulty or significant challenge indicating loosening as well.  Once  this was done, I was able to subluxate the tibia further and remove the tibial component.  We then debrided all the soft tissue, nonviable tissue off the distal femur  and proximal tibia that we could see.  I then used an extramedullary guide and made a recut just underneath the cement interface that was remaining on the proximal tibia.  We then removed the remaining cement within the proximal tibia.  I then opened up  the distal femur and proximal tibia and then began reaming. We reamed up to 16 mm on the femoral side, an 18 mm on the tibial side.  Once this was done on the tibia side I selected a size 6 tibial tray and broached initially with a starting broach and  then to the 37 mm broach, which sat proud of the proximal tibia as to be expected and anticipated.  I then built a tibial trial on this.  Once this was done, we focused on the femur.  I selected a size 6 femur using a trial for sizing purposes.  We then  per protocol revisited all the cuts including distal femur, then the anterior, posterior and chamfer cuts.  I selected a 4 mm posterior medial augment.  At this point, we did a trial reduction with the femoral component and the tibial component in place.   When I placed the 6 mm insert and found that the knee had hyperextension.  For that reason, I added 8 mm augment distal medial and lateral.  Further trial reductions deemed that I would either be using a size 8 or 10 insert.  Nonetheless, the knee was  balanced with one of these inserts in place.  At this point, all trial components were removed.  I selected a size 5 cement restrictor and placed into the distal femur measuring off the pre-made stem with 130 mm stem.  I irrigated all the canals with  pulse lavage irrigation and canal brush irrigator.  The remainder of the knee was irrigated with normal saline solution.  We injected the posterior capsule with 0.25% Marcaine with epinephrine, 1 mL of Toradol and saline.  The final components were  then  configured on the back table, opened and configured on under direct supervision.  Once they were prepared.  The final tibial component was impacted and sat basically where the broach had sat.  There was potentially a little crack in the anterior medial  tibia, but there was no evidence of it, extending further, than 5-6 mm.  This bone was also a poor quality, so difficult to determine whether it was the acuity of it or not.  There was no extension of any correct posterior medial.  Once this was then  placed a cement was mixed.  The final femoral component was then cemented into place and the knee brought to extension with a 10 mm insert.  Excessive cement was removed.  The knee was kept in full extension until the cement fully cured.  Once this was  done, we removed any remaining cement that was visualized.  The tourniquet had been let down after 75 minutes.  No significant hemostasis was necessary.  There was significant oozing noted from the synovium, but nothing that  could be addressed fully.   The final 10 mm CRS rotating platform insert was then placed in the tibial tray.  The knee was reduced.  We reirrigated the knee with both 400+ mL of Aricept leaving in the joint for about a minute or two and then remaining 2 liters of normal saline  solution.  At this point, the extensor mechanism was reapproximated using #1 Vicryl and a running #1 Stratafix suture.  The remainder of the wound was closed with 2-0 Vicryl and a running Monocryl stitch.  The knee was then cleaned, dried and dressed  sterilely using surgical glue and Aquacel dressing.  The patient was brought to the recovery room in stable condition, tolerating the procedure well.  Findings reviewed with family.  We will have him be partial weightbearing based on the press-fit nature  of the proximal tibia for probably 6 weeks.  He will otherwise be free to work on range of motion and trying to regain his functional quality of life.   PUS D:  10/19/2022 11:04:54 am T: 10/19/2022 11:34:00 am  JOB: [id number]

## 2022-10-19 NOTE — Evaluation (Signed)
Physical Therapy Evaluation Patient Details Name: Micheal Lucas MRN: [id number] DOB: [birth year] Today's Date: 10/19/2022  History of Present Illness  Pt s/p R TKR revision and with hx of R TKR in 2017  Clinical Impression  Pt s/p R TKR revision and presents with decreased R LE strength/ROM and post op pain limiting functional mobility.  Pt motivated and should progress well to dc home with family assist.  Pt reports first OP PT scheduled for 10/23/22     Recommendations for follow up therapy are one component of a multi-disciplinary discharge planning process, led by the attending physician.  Recommendations may be updated based on patient status, additional functional criteria and insurance authorization.  Follow Up Recommendations Follow physician's recommendations for discharge plan and follow up therapies      Assistance Recommended at Discharge Intermittent Supervision/Assistance  Patient can return home with the following  A little help with walking and/or transfers;A little help with bathing/dressing/bathroom;Assistance with cooking/housework;Assist for transportation;Help with stairs or ramp for entrance    Equipment Recommendations Rolling walker (2 wheels)  Recommendations for Other Services       Functional Status Assessment Patient has had a recent decline in their functional status and demonstrates the ability to make significant improvements in function in a reasonable and predictable amount of time.     Precautions / Restrictions Precautions Precautions: Fall;Knee Restrictions Weight Bearing Restrictions: No Other Position/Activity Restrictions: WBAT      Mobility  Bed Mobility Overal bed mobility: Modified Independent             General bed mobility comments: no physical assist    Transfers Overall transfer level: Needs assistance Equipment used: Rolling walker (2 wheels) Transfers: Sit to/from Stand Sit to Stand: Min assist, Min guard            General transfer comment: cues for LE management and use of UEs to self assist    Ambulation/Gait Ambulation/Gait assistance: Min assist Gait Distance (Feet): 38 Feet Assistive device: Rolling walker (2 wheels) Gait Pattern/deviations: Step-to pattern, Decreased step length - right, Decreased step length - left, Shuffle, Trunk flexed Gait velocity: decr     General Gait Details: Increased time with cues for sequence, posture and position from [employer]            Wheelchair Mobility    Modified Rankin (Stroke Patients Only)       Balance Overall balance assessment: Needs assistance Sitting-balance support: No upper extremity supported, Feet supported Sitting balance-Leahy Scale: Good     Standing balance support: Single extremity supported Standing balance-Leahy Scale: Poor                               Pertinent Vitals/Pain Pain Assessment Pain Assessment: 0-10 Pain Score: 5  Pain Location: R knee Pain Descriptors / Indicators: Aching, Sore Pain Intervention(s): Limited activity within patient's tolerance, Monitored during session, Premedicated before session, Ice applied    Home Living Family/patient expects to be discharged to:: Private residence Living Arrangements: Spouse/significant other Available Help at Discharge: Family;Available 24 hours/day Type of Home: House Home Access: Stairs to enter Entrance Stairs-Rails: Psychiatric nurse of Steps: 4   Home Layout: One level Home Equipment: Crutches Additional Comments: vanity next to comode; spouse also had TKR last year    Prior Function Prior Level of Function : Independent/Modified Independent  Hand Dominance        Extremity/Trunk Assessment   Upper Extremity Assessment Upper Extremity Assessment: Overall WFL for tasks assessed    Lower Extremity Assessment Lower Extremity Assessment: RLE deficits/detail RLE Deficits /  Details: Able to perform IND SLR with min extensor lag    Cervical / Trunk Assessment Cervical / Trunk Assessment: Normal  Communication   Communication: No difficulties  Cognition Arousal/Alertness: Awake/alert Behavior During Therapy: WFL for tasks assessed/performed Overall Cognitive Status: Within Functional Limits for tasks assessed                                          General Comments      Exercises Total Joint Exercises Ankle Circles/Pumps: AROM, Both, 15 reps, Supine Straight Leg Raises: AROM, Right, 5 reps, Supine   Assessment/Plan    PT Assessment Patient needs continued PT services  PT Problem List Decreased strength;Decreased range of motion;Decreased activity tolerance;Decreased balance;Decreased mobility;Decreased knowledge of use of DME;Pain       PT Treatment Interventions DME instruction;Gait training;Stair training;Therapeutic activities;Functional mobility training;Therapeutic exercise;Patient/family education    PT Goals (Current goals can be found in the Care Plan section)  Acute Rehab PT Goals Patient Stated Goal: Regain IND PT Goal Formulation: With patient Time For Goal Achievement: 10/26/22 Potential to Achieve Goals: Good    Frequency 7X/week     Co-evaluation               AM-PAC PT "6 Clicks" Mobility  Outcome Measure Help needed turning from your back to your side while in a flat bed without using bedrails?: A Little Help needed moving from lying on your back to sitting on the side of a flat bed without using bedrails?: A Little Help needed moving to and from a bed to a chair (including a wheelchair)?: A Little Help needed standing up from a chair using your arms (e.g., wheelchair or bedside chair)?: A Little Help needed to walk in hospital room?: A Little Help needed climbing 3-5 steps with a railing? : A Lot 6 Click Score: 17    End of Session Equipment Utilized During Treatment: Gait belt Activity  Tolerance: Patient tolerated treatment well Patient left: in chair;with call bell/phone within reach;with chair alarm set;with family/visitor present Nurse Communication: Mobility status PT Visit Diagnosis: Difficulty in walking, not elsewhere classified (R26.2)    Time: [id number] PT Time Calculation (min) (ACUTE ONLY): 27 min   Charges:   PT Evaluation $PT Eval Low Complexity: 1 Low PT Treatments $Gait Training: 8-22 mins        [place] Pager [phone] Office [phone]   Krishay Faro 10/19/2022, 3:45 PM

## 2022-10-20 DIAGNOSIS — T84032A Mechanical loosening of internal right knee prosthetic joint, initial encounter: Secondary | ICD-10-CM | POA: Diagnosis not present

## 2022-10-20 LAB — CBC
HCT: 33.4 % — ABNORMAL LOW (ref 39.0–52.0)
Hemoglobin: 10.5 g/dL — ABNORMAL LOW (ref 13.0–17.0)
MCH: 23.1 pg — ABNORMAL LOW (ref 26.0–34.0)
MCHC: 31.4 g/dL (ref 30.0–36.0)
MCV: 73.4 fL — ABNORMAL LOW (ref 80.0–100.0)
Platelets: 181 10*3/uL (ref 150–400)
RBC: 4.55 MIL/uL (ref 4.22–5.81)
RDW: 15.2 % (ref 11.5–15.5)
WBC: 16.3 10*3/uL — ABNORMAL HIGH (ref 4.0–10.5)
nRBC: 0 % (ref 0.0–0.2)

## 2022-10-20 LAB — BASIC METABOLIC PANEL
Anion gap: 10 (ref 5–15)
BUN: 19 mg/dL (ref 8–23)
CO2: 23 mmol/L (ref 22–32)
Calcium: 8.9 mg/dL (ref 8.9–10.3)
Chloride: 102 mmol/L (ref 98–111)
Creatinine, Ser: 1.02 mg/dL (ref 0.61–1.24)
GFR, Estimated: >60 mL/min (ref >60)
Glucose, Bld: 134 mg/dL — ABNORMAL HIGH (ref 70–99)
Potassium: 4.1 mmol/L (ref 3.5–5.1)
Sodium: 135 mmol/L (ref 135–145)

## 2022-10-20 MED ORDER — TRANEXAMIC ACID 650 MG PO TABS
1950.0000 mg | ORAL_TABLET | Freq: Every day | ORAL | Status: DC
  Administered 2022-10-20: 1950 mg via ORAL
  Filled 2022-10-20: qty 3

## 2022-10-20 MED ORDER — METHOCARBAMOL 500 MG PO TABS: 500.0000 mg | ORAL_TABLET | Freq: Four times a day (QID) | ORAL | 2 refills | Status: AC | PRN

## 2022-10-20 MED ORDER — CELECOXIB 200 MG PO CAPS: 200.0000 mg | ORAL_CAPSULE | Freq: Two times a day (BID) | ORAL | 0 refills | Status: AC

## 2022-10-20 MED ORDER — ASPIRIN 81 MG PO CHEW: 81.0000 mg | CHEWABLE_TABLET | Freq: Two times a day (BID) | ORAL | 0 refills | Status: AC

## 2022-10-20 MED ORDER — OXYCODONE HCL 5 MG PO TABS: 5.0000 mg | ORAL_TABLET | ORAL | 0 refills | Status: DC | PRN

## 2022-10-20 MED ORDER — POLYETHYLENE GLYCOL 3350 17 G PO PACK: 17.0000 g | PACK | Freq: Two times a day (BID) | ORAL | 0 refills | Status: DC

## 2022-10-20 MED ORDER — CELECOXIB 200 MG PO CAPS: 200.0000 mg | ORAL_CAPSULE | Freq: Two times a day (BID) | ORAL | 0 refills | Status: DC

## 2022-10-20 MED ORDER — OXYCODONE HCL 5 MG PO TABS: 5.0000 mg | ORAL_TABLET | ORAL | 0 refills | Status: AC | PRN

## 2022-10-20 MED ORDER — TRANEXAMIC ACID 650 MG PO TABS: 1950.0000 mg | ORAL_TABLET | Freq: Every day | ORAL | 0 refills | Status: AC

## 2022-10-20 MED ORDER — ASPIRIN 81 MG PO CHEW: 81.0000 mg | CHEWABLE_TABLET | Freq: Two times a day (BID) | ORAL | 0 refills | Status: DC

## 2022-10-20 MED ORDER — METHOCARBAMOL 500 MG PO TABS: 500.0000 mg | ORAL_TABLET | Freq: Four times a day (QID) | ORAL | 2 refills | Status: DC | PRN

## 2022-10-20 MED ORDER — POLYETHYLENE GLYCOL 3350 17 G PO PACK: 17.0000 g | PACK | Freq: Two times a day (BID) | ORAL | 0 refills | Status: AC

## 2022-10-20 MED ORDER — SENNA 8.6 MG PO TABS: 1.0000 | ORAL_TABLET | Freq: Every day | ORAL | 0 refills | Status: AC

## 2022-10-20 MED ORDER — TRANEXAMIC ACID 650 MG PO TABS: 1950.0000 mg | ORAL_TABLET | Freq: Every day | ORAL | 0 refills | Status: DC

## 2022-10-20 NOTE — Progress Notes (Signed)
Physical Therapy Treatment Patient Details Name: Micheal Lucas MRN: [id number] DOB: 1955/09/16 Today's Date: 10/20/2022   History of Present Illness Pt s/p R TKR revision and with hx of R TKR in 2017    PT Comments    Pt very motivated and progressing well with mobility.  Pt up to ambulate in hall (complying well with new PWB status), negotiated stairs, performed HEP with written instruction provided and reviewed; and with multiple questions asked and answered.  Pt eager for dc home this date.   Recommendations for follow up therapy are one component of a multi-disciplinary discharge planning process, led by the attending physician.  Recommendations may be updated based on patient status, additional functional criteria and insurance authorization.  Follow Up Recommendations  Follow physician's recommendations for discharge plan and follow up therapies     Assistance Recommended at Discharge Intermittent Supervision/Assistance  Patient can return home with the following A little help with walking and/or transfers;A little help with bathing/dressing/bathroom;Assistance with cooking/housework;Assist for transportation;Help with stairs or ramp for entrance   Equipment Recommendations  Rolling walker (2 wheels)    Recommendations for Other Services       Precautions / Restrictions Precautions Precautions: Fall;Knee Restrictions Weight Bearing Restrictions: Yes RLE Weight Bearing: Partial weight bearing RLE Partial Weight Bearing Percentage or Pounds: 50% Other Position/Activity Restrictions: changed from initial order of WBAT     Mobility  Bed Mobility Overal bed mobility: Modified Independent             General bed mobility comments: no physical assist    Transfers Overall transfer level: Needs assistance Equipment used: Rolling walker (2 wheels) Transfers: Sit to/from Stand Sit to Stand: Min guard, Supervision           General transfer comment: cues for LE  management and use of UEs to self assist    Ambulation/Gait Ambulation/Gait assistance: Min guard, Supervision Gait Distance (Feet): 98 Feet Assistive device: Rolling walker (2 wheels) Gait Pattern/deviations: Step-to pattern, Decreased step length - right, Decreased step length - left, Shuffle, Trunk flexed Gait velocity: decr     General Gait Details: Increased time with cues for sequence, posture and position from RW   Stairs Stairs: Yes Stairs assistance: Min assist Stair Management: One rail Right, Step to pattern, Forwards, With crutches Number of Stairs: 10 General stair comments: cues for sequence and foot/crutch placement   Wheelchair Mobility    Modified Rankin (Stroke Patients Only)       Balance Overall balance assessment: Needs assistance Sitting-balance support: No upper extremity supported, Feet supported Sitting balance-Leahy Scale: Good     Standing balance support: No upper extremity supported Standing balance-Leahy Scale: Fair                              Cognition Arousal/Alertness: Awake/alert Behavior During Therapy: WFL for tasks assessed/performed Overall Cognitive Status: Within Functional Limits for tasks assessed                                          Exercises Total Joint Exercises Ankle Circles/Pumps: AROM, Both, 15 reps, Supine Quad Sets: AROM, Both, 10 reps, Supine Heel Slides: AAROM, Right, 15 reps, Supine Straight Leg Raises: AROM, Right, Supine, 15 reps Long Arc Quad: AROM, Right, 10 reps, Seated    General Comments  Pertinent Vitals/Pain Pain Assessment Pain Assessment: 0-10 Pain Score: 4  Pain Location: R knee Pain Descriptors / Indicators: Aching, Sore Pain Intervention(s): Limited activity within patient's tolerance, Monitored during session, Premedicated before session    Home Living                          Prior Function            PT Goals (current goals  can now be found in the care plan section) Acute Rehab PT Goals Patient Stated Goal: Regain IND PT Goal Formulation: With patient Time For Goal Achievement: 10/26/22 Potential to Achieve Goals: Good Progress towards PT goals: Progressing toward goals    Frequency    7X/week      PT Plan Current plan remains appropriate    Co-evaluation              AM-PAC PT "6 Clicks" Mobility   Outcome Measure  Help needed turning from your back to your side while in a flat bed without using bedrails?: A Little Help needed moving from lying on your back to sitting on the side of a flat bed without using bedrails?: A Little Help needed moving to and from a bed to a chair (including a wheelchair)?: A Little Help needed standing up from a chair using your arms (e.g., wheelchair or bedside chair)?: A Little Help needed to walk in hospital room?: A Little Help needed climbing 3-5 steps with a railing? : A Little 6 Click Score: 18    End of Session Equipment Utilized During Treatment: Gait belt Activity Tolerance: Patient tolerated treatment well Patient left: in chair;with call bell/phone within reach;with chair alarm set;with family/visitor present Nurse Communication: Mobility status PT Visit Diagnosis: Difficulty in walking, not elsewhere classified (R26.2)     Time: [id number] PT Time Calculation (min) (ACUTE ONLY): 49 min  Charges:  $Gait Training: 8-22 mins $Therapeutic Exercise: 8-22 mins $Therapeutic Activity: 8-22 mins                     Debe Coder PT Acute Rehabilitation Services Pager [phone] Office [phone]    Omayra Tulloch 10/20/2022, 12:19 PM

## 2022-10-20 NOTE — Progress Notes (Signed)
   Subjective: 1 Day Post-Op Procedure(s) (LRB): TOTAL KNEE REVISION (Right) Patient reports pain as mild.   Patient seen in rounds with Dr. Alvan Dame. Patient is well, and has had no acute complaints or problems. No acute events overnight. Foley catheter removed. Patient ambulated 38 feet with PT.  We will start therapy today.   Objective: Vital signs in last 24 hours: Temp:  [97.1 F (36.2 C)-98.2 F (36.8 C)] 98.2 F (36.8 C) (03/08 0600) Pulse Rate:  [62-93] 84 (03/08 0600) Resp:  [11-18] 18 (03/08 0600) BP: (121-177)/(65-84) 164/66 (03/08 0600) SpO2:  [92 %-100 %] 99 % (03/08 0600) Weight:  [98.4 kg] 98.4 kg (03/07 1442)  Intake/Output from previous day:  Intake/Output Summary (Last 24 hours) at 10/20/2022 0817 Last data filed at 10/20/2022 0600 Gross per 24 hour  Intake 3287.42 ml  Output 3125 ml  Net 162.42 ml     Intake/Output this shift: No intake/output data recorded.  Labs: Recent Labs    10/20/22 0337  HGB 10.5*   Recent Labs    10/20/22 0337  WBC 16.3*  RBC 4.55  HCT 33.4*  PLT 181   Recent Labs    10/20/22 0337  NA 135  K 4.1  CL 102  CO2 23  BUN 19  CREATININE 1.02  GLUCOSE 134*  CALCIUM 8.9   No results for input(s): "LABPT", "INR" in the last 72 hours.  Exam: General - Patient is Alert and Oriented Extremity - Neurologically intact Sensation intact distally Intact pulses distally Dorsiflexion/Plantar flexion intact Dressing - dressing C/D/I Motor Function - intact, moving foot and toes well on exam.   Past Medical History:  Diagnosis Date   Anemia    Arthritis    oa   Bruit of right carotid artery    Cataract    right eye   Elevated cholesterol    GERD (gastroesophageal reflux disease)    Hypertension    Plantar fascial fibromatosis     Assessment/Plan: 1 Day Post-Op Procedure(s) (LRB): TOTAL KNEE REVISION (Right) Principal Problem:   S/P revision of total knee, right  Estimated body mass index is 27.86 kg/m as  calculated from the following:   Height as of this encounter: 6\' 2"  (1.88 m).   Weight as of this encounter: 98.4 kg. Advance diet Up with therapy D/C IV fluids   Patient's anticipated LOS is less than 2 midnights, meeting these requirements: - Younger than 34 - Lives within 1 hour of care - Has a competent adult at home to recover with post-op recover - NO history of  - Chronic pain requiring opiods  - Diabetes  - Coronary Artery Disease  - Heart failure  - Heart attack  - Stroke  - DVT/VTE  - Cardiac arrhythmia  - Respiratory Failure/COPD  - Renal failure  - Anemia  - Advanced Liver disease     DVT Prophylaxis - Aspirin Weight bearing as tolerated.  Hgb stable at 10.5 this AM.  Plan is to go Home after hospital stay. Plan for discharge today following 1-2 sessions of PT as long as they are meeting their goals. Patient is scheduled for OPPT. Follow up in the office in 2 weeks.   Griffith Citron, PA-C Orthopedic Surgery [phone] 10/20/2022, 8:17 AM

## 2022-10-20 NOTE — Progress Notes (Signed)
The patient is alert and oriented and has been seen by his physician. The orders for discharge were written. Aquacel dressing is clean, dry, and intact. IV has been removed. Went over discharge instructions with patient and family. He is being discharged via wheelchair with all of his belongings.

## 2022-10-20 NOTE — Plan of Care (Signed)
  Problem: Education: Goal: Knowledge of General Education information will improve Description Including pain rating scale, medication(s)/side effects and non-pharmacologic comfort measures Outcome: Progressing   

## 2022-10-20 NOTE — TOC Transition Note (Signed)
Transition of Care [facility]) - CM/SW Discharge Note  Patient Details  Name: Micheal Lucas MRN: [id number] Date of Birth: 27-Jun-1956  [facility] of [facility] CM/SW Contact:  Sherie Don, LCSW Phone Number: 10/20/2022, 9:48 AM  Clinical Narrative: Patient is expected to discharge home after working with PT. CSW met with patient to confirm discharge plan and needs. Patient will go home with OPPT at [facility]. Patient will need a rolling walker and 3N1. Patient is agreeable to private paying for the 3N1. [employer] delivered the rolling walker and 3N1 to patient's room. TOC signing off.    Final next level of care: OP Rehab Barriers to Discharge: No Barriers Identified  Patient Goals and CMS Choice CMS Medicare.gov Compare Post Acute Care list provided to:: Patient Choice offered to / list presented to : Patient  Discharge Plan and Services Additional resources added to the After Visit Summary for            DME Arranged: 3-N-1, Walker rolling DME Agency: [employer] Date DME Agency Contacted: 10/20/22 Representative spoke with at DME Agency: [place] Determinants of Health ([place]) Interventions SDOH Screenings   Food Insecurity: No Food Insecurity (10/19/2022)  Housing: Low Risk  (10/19/2022)  Transportation Needs: No Transportation Needs (10/19/2022)  Utilities: Not At Risk (10/19/2022)  Tobacco Use: Medium Risk (10/19/2022)   Readmission Risk Interventions     No data to display

## 2022-10-20 NOTE — Plan of Care (Signed)
  Problem: Education: Goal: Knowledge of the prescribed therapeutic regimen will improve Outcome: Progressing   Problem: Activity: Goal: Range of joint motion will improve Outcome: Progressing   Problem: Pain Management: Goal: Pain level will decrease with appropriate interventions Outcome: Progressing   Problem: Safety: Goal: Ability to remain free from injury will improve Outcome: Progressing   

## 2022-10-26 ENCOUNTER — Encounter (HOSPITAL_COMMUNITY): Payer: Self-pay | Admitting: Orthopedic Surgery

## 2022-11-02 NOTE — Discharge Summary (Signed)
Patient ID: Micheal Lucas MRN: [id number] DOB/AGE: [birth year] 67 y.o.  Admit date: 10/19/2022 Discharge date: 10/20/2022  Admission Diagnoses:  Aseptic loosening, right total knee  Discharge Diagnoses:  Principal Problem:   S/P revision of total knee, right   Past Medical History:  Diagnosis Date   Anemia    Arthritis    oa   Bruit of right carotid artery    Cataract    right eye   Elevated cholesterol    GERD (gastroesophageal reflux disease)    Hypertension    Plantar fascial fibromatosis     Surgeries: Procedure(s): TOTAL KNEE REVISION on 10/19/2022   Consultants:   Discharged Condition: Improved  Hospital Course: Micheal Lucas is an 67 y.o. male who was admitted 10/19/2022 for operative treatment ofS/P revision of total knee, right. Patient has severe unremitting pain that affects sleep, daily activities, and work/hobbies. After pre-op clearance the patient was taken to the operating room on 10/19/2022 and underwent  Procedure(s): TOTAL KNEE REVISION.    Patient was given perioperative antibiotics:  Anti-infectives (From admission, onward)    Start     Dose/Rate Route Frequency Ordered Stop   10/19/22 1500  ceFAZolin (ANCEF) IVPB 2g/100 mL premix        2 g 200 mL/hr over 30 Minutes Intravenous Every 6 hours 10/19/22 1412 10/20/22 0705   10/19/22 0630  ceFAZolin (ANCEF) IVPB 2g/100 mL premix        2 g 200 mL/hr over 30 Minutes Intravenous On call to O.R. 10/19/22 [id number] 10/19/22 [id number]        Patient was given sequential compression devices, early ambulation, and chemoprophylaxis to prevent DVT. Patient worked with PT and was meeting their goals regarding safe ambulation and transfers.  Patient benefited maximally from hospital stay and there were no complications.    Recent vital signs: No data found.   Recent laboratory studies: No results for input(s): "WBC", "HGB", "HCT", "PLT", "NA", "K", "CL", "CO2", "BUN", "CREATININE", "GLUCOSE", "INR", "CALCIUM" in  the last 72 hours.  Invalid input(s): "PT", "2"   Discharge Medications:   Allergies as of 10/20/2022       Reactions   Morphine Rash        Medication List     STOP taking these medications    aspirin EC 81 MG tablet Replaced by: aspirin 81 MG chewable tablet   diclofenac 75 MG EC tablet Commonly known as: VOLTAREN   docusate sodium 100 MG capsule Commonly known as: COLACE   HYDROcodone-acetaminophen 7.5-325 MG tablet Commonly known as: NORCO       TAKE these medications    amLODipine 2.5 MG tablet Commonly known as: NORVASC Take 2.5 mg by mouth daily.   aspirin 81 MG chewable tablet Chew 1 tablet (81 mg total) by mouth 2 (two) times daily for 28 days. Replaces: aspirin EC 81 MG tablet   carvedilol 3.125 MG tablet Commonly known as: COREG Take 3.125 mg by mouth 2 (two) times daily with a meal.   celecoxib 200 MG capsule Commonly known as: CELEBREX Take 1 capsule (200 mg total) by mouth 2 (two) times daily.   ferrous sulfate 325 (65 FE) MG tablet Take 1 tablet (325 mg total) by mouth 3 (three) times daily after meals.   gabapentin 300 MG capsule Commonly known as: NEURONTIN Take 300 mg by mouth daily.   GARLIQUE PO Take 1 tablet by mouth daily.   methocarbamol 500 MG tablet Commonly known as: ROBAXIN Take 1 tablet (500 mg total) by  mouth every 6 (six) hours as needed for muscle spasms.   multivitamin with minerals Tabs tablet Take 1 tablet by mouth daily.   omeprazole 20 MG capsule Commonly known as: PRILOSEC Take 20 mg by mouth daily.   oxyCODONE 5 MG immediate release tablet Commonly known as: Oxy IR/ROXICODONE Take 1 tablet (5 mg total) by mouth every 4 (four) hours as needed for severe pain.   polyethylene glycol 17 g packet Commonly known as: MIRALAX / GLYCOLAX Take 17 g by mouth 2 (two) times daily.   pravastatin 40 MG tablet Commonly known as: PRAVACHOL Take 40 mg by mouth every evening.   senna 8.6 MG Tabs tablet Commonly  known as: SENOKOT Take 1 tablet (8.6 mg total) by mouth at bedtime for 14 days.   terazosin 1 MG capsule Commonly known as: HYTRIN Take 1 mg by mouth daily.   Timolol Maleate (Once-Daily) 0.5 % Soln Place 1 drop into the left eye in the morning.   Vyzulta 0.024 % Soln Generic drug: Latanoprostene Bunod Place 1 drop into the left eye at bedtime.       ASK your doctor about these medications    tranexamic acid 650 MG Tabs tablet Commonly known as: LYSTEDA Take 3 tablets (1,950 mg total) by mouth daily for 2 days. Ask about: Should I take this medication?               Discharge Care Instructions  (From admission, onward)           Start     Ordered   10/20/22 0000  Change dressing       Comments: Maintain surgical dressing until follow up in the clinic. If the edges start to pull up, may reinforce with tape. If the dressing is no longer working, may remove and cover with gauze and tape, but must keep the area dry and clean.  Call with any questions or concerns.   10/20/22 0823            Diagnostic Studies: No results found.  Disposition: Discharge disposition: 01-Home or Self Care       Discharge Instructions     Call MD / Call 911   Complete by: As directed    If you experience chest pain or shortness of breath, CALL 911 and be transported to the hospital emergency room.  If you develope a fever above 101 F, pus (white drainage) or increased drainage or redness at the wound, or calf pain, call your surgeon's office.   Change dressing   Complete by: As directed    Maintain surgical dressing until follow up in the clinic. If the edges start to pull up, may reinforce with tape. If the dressing is no longer working, may remove and cover with gauze and tape, but must keep the area dry and clean.  Call with any questions or concerns.   Constipation Prevention   Complete by: As directed    Drink plenty of fluids.  Prune juice may be helpful.  You may use  a stool softener, such as Colace (over the counter) 100 mg twice a day.  Use MiraLax (over the counter) for constipation as needed.   Diet - low sodium heart healthy   Complete by: As directed    Increase activity slowly as tolerated   Complete by: As directed    Partial Weight Bearing   Post-operative opioid taper instructions:   Complete by: As directed    POST-OPERATIVE OPIOID TAPER INSTRUCTIONS: It  is important to wean off of your opioid medication as soon as possible. If you do not need pain medication after your surgery it is ok to stop day one. Opioids include: Codeine, Hydrocodone(Norco, Vicodin), Oxycodone(Percocet, oxycontin) and hydromorphone amongst others.  Long term and even short term use of opiods can cause: Increased pain response Dependence Constipation Depression Respiratory depression And more.  Withdrawal symptoms can include Flu like symptoms Nausea, vomiting And more Techniques to manage these symptoms Hydrate well Eat regular healthy meals Stay active Use relaxation techniques(deep breathing, meditating, yoga) Do Not substitute Alcohol to help with tapering If you have been on opioids for less than two weeks and do not have pain than it is ok to stop all together.  Plan to wean off of opioids This plan should start within one week post op of your joint replacement. Maintain the same interval or time between taking each dose and first decrease the dose.  Cut the total daily intake of opioids by one tablet each day Next start to increase the time between doses. The last dose that should be eliminated is the evening dose.      TED hose   Complete by: As directed    Use stockings (TED hose) for 2 weeks on both leg(s).  You may remove them at night for sleeping.        Follow-up Information     Paralee Cancel, MD. Schedule an appointment as soon as possible for a visit in 2 week(s).   Specialty: Orthopedic Surgery Contact information: [address] [place] [address] [id number]                  Signed: Irving Copas 11/02/2022, 7:15 AM

## 2023-01-30 ENCOUNTER — Ambulatory Visit: Payer: BC Managed Care – PPO | Attending: Orthopedic Surgery

## 2023-01-30 ENCOUNTER — Other Ambulatory Visit: Payer: Self-pay

## 2023-01-30 DIAGNOSIS — M5441 Lumbago with sciatica, right side: Secondary | ICD-10-CM | POA: Insufficient documentation

## 2023-01-30 DIAGNOSIS — M5442 Lumbago with sciatica, left side: Secondary | ICD-10-CM | POA: Diagnosis present

## 2023-01-30 DIAGNOSIS — G8929 Other chronic pain: Secondary | ICD-10-CM | POA: Diagnosis present

## 2023-01-30 DIAGNOSIS — R262 Difficulty in walking, not elsewhere classified: Secondary | ICD-10-CM

## 2023-01-30 NOTE — Therapy (Unsigned)
OUTPATIENT PHYSICAL THERAPY THORACOLUMBAR EVALUATION   Patient Name: Micheal Lucas MRN: [id number] DOB:[birth year], 67 y.o., male Today's Date: 01/30/2023  END OF SESSION:  PT End of Session - 01/30/23 0803     Visit Number 1    Date for PT Re-Evaluation 04/24/23    Progress Note Due on Visit 10    PT Start Time 0803    PT Stop Time 0845    PT Time Calculation (min) 42 min    Activity Tolerance Patient tolerated treatment well    Behavior During Therapy [facility] for tasks assessed/performed             Past Medical History:  Diagnosis Date   Anemia    Arthritis    oa   Bruit of right carotid artery    Cataract    right eye   Elevated cholesterol    GERD (gastroesophageal reflux disease)    Hypertension    Plantar fascial fibromatosis    Past Surgical History:  Procedure Laterality Date   colonscopy  age 40   EYE SURGERY Left 04/10/2016   cataract surgery   EYE SURGERY Bilateral 2015   stents   knee scope and cartlidge implant Left 2009   TOTAL KNEE ARTHROPLASTY Right 05/23/2016   Procedure: RIGHT TOTAL KNEE ARTHROPLASTY;  Surgeon: Durene Romans, MD;  Location: [facility];  Service: Orthopedics;  Laterality: Right;  Adductor block   TOTAL KNEE REVISION Right 10/19/2022   Procedure: TOTAL KNEE REVISION;  Surgeon: Durene Romans, MD;  Location: [facility];  Service: Orthopedics;  Laterality: Right;   Patient Active Problem List   Diagnosis Date Noted   S/P revision of total knee, 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   Obese 05/24/2016   S/P right TKA 05/23/2016   S/P knee replacement 05/23/2016    PCP: Mattie Marlin , MD  REFERRING PROVIDER: Venita Lick  REFERRING DIAG: chronic lower back pain  Rationale for Evaluation and Treatment: Rehabilitation  THERAPY DIAG:  Chronic bilateral low back pain with bilateral sciatica  Difficulty in walking, not elsewhere classified  ONSET DATE: 4 years, since 2020  SUBJECTIVE:                                                                                                                                                                                            SUBJECTIVE STATEMENT: Back pain , central lower , for a few years.  Had hoped that revision of R TKA would have helped but no change in Sx.  Hurts constantly, pain at night, with standing, sitting, walking  PERTINENT HISTORY:  Chronic lower back pain, revision R TKA 3 months ago.  Recovered  from TKA, now referred to PT , MD specifically requested AQUATIC PT  PAIN:  Are you having pain? Yes: NPRS scale: 4/10 Pain location: lower back, B lateral thighs Pain description: constant unyielding Aggravating factors: activity, sleeping Relieving factors: none  PRECAUTIONS: None  WEIGHT BEARING RESTRICTIONS: No  FALLS:  Has patient fallen in last 6 months? No  LIVING ENVIRONMENT: Lives with: lives with their spouse Lives in: House/apartment Stairs: Yes: External: 2 steps; on left going up Has following equipment at home: Single point cane and Walker - 2 wheeled  OCCUPATION: retired  PLOF: Independent  PATIENT GOALS: Improve my mobility, gait, pain level, be able to sleep  NEXT MD VISIT: unclear  OBJECTIVE:   DIAGNOSTIC FINDINGS: MRI  May 2024:  IMPRESSION: 1. At L4-L5 there is moderate bilateral subarticular recess stenosis without substantial central canal stenosis. 2. Mild foraminal stenosis bilaterally at L3-L4 and L4-L5.  PATIENT SURVEYS:  Modified Oswestry 29/50   SCREENING FOR RED FLAGS: Bowel or bladder incontinence: No Spinal tumors: No Cauda equina syndrome: No Compression fracture: No Abdominal aneurysm: No  COGNITION: Overall cognitive status: Within functional limits for tasks assessed     SENSATION: WFL  MUSCLE LENGTH: Hamstrings: Right -55 deg; Left wnl deg Prone knee flexion : Right -10 deg; Left -10 deg, reproduction of pain L$/5 region with overpressure   POSTURE: weight shift L, R knee mildly flexed  PALPATION: Tender ,  painful, reproduction of pain central segmental PA 's lower thoracic spine l T 9,to entire  LS spine,spinous processes Pain B glut medius, maximus  LUMBAR ROM:   AROM eval  Flexion 75%  Extension 25%  Right lateral flexion 25%  Left lateral flexion 25%  Right rotation   Left rotation    (Blank rows = not tested)  LOWER EXTREMITY ROM:     All LE ROM wfl LOWER EXTREMITY MMT:    MMT Right eval Left eval  Hip flexion    Hip extension 4- 4-  Hip abduction 4- 4-  Hip adduction    Hip internal rotation    Hip external rotation    Knee flexion    Knee extension 4-   Ankle dorsiflexion HW wnl "  Ankle plantarflexion TW wnl "  Ankle inversion    Ankle eversion     (Blank rows = not tested)  LUMBAR SPECIAL TESTS:  Straight leg raise test: Positive, Slump test: Negative, and prone knee bend (L4) neural stretch with + LBP, familiar sign  FUNCTIONAL TESTS:  30 seconds chair stand test  7  GAIT: Distance walked: over 100' in clinic TODAY'S TREATMENT:                                                                                                                              DATE: 01/30/23 :  Prone for DN Trigger Point Dry-Needling  Treatment instructions: Expect mild to moderate muscle soreness. S/S of pneumothorax if dry needled over a lung field,  and to seek immediate medical attention should they occur. Patient verbalized understanding of these instructions and education. Patient Consent Given: Yes Education handout provided: No Muscles treated: L glut med, L glut max 3 pts total Electrical stimulation performed: No Parameters:    Treatment response/outcome: Twitch Response Elicited   PATIENT EDUCATION:  Education details: POC, goals Person educated: Patient Education method: Explanation, Demonstration, Tactile cues, Verbal cues, and Handouts Education comprehension: verbalized understanding, returned demonstration, and verbal cues required  HOME EXERCISE  PROGRAM: 01/30/23: demo'd to patient self pelvic traction by suspending over bed for sacral distraction.  Also seated R hamstring stretcth with towel with hip hinge for R hamstring .    ASSESSMENT:  CLINICAL IMPRESSION: Patient is a 67 y.o. male who was seen today for physical therapy evaluation and treatment for chronic lower back pain and radicular pain. He is inflamed in soft tissue and jts lower thoracic and lumbar region, and B post hips. He also has very restricted R hamstring flexibility, possibly due to recent revision R TKA .  He is to begin aquatic PT at next available appt. He did respond well initially to DN today.  Will try to establish home program and improve the pain, soft tissue Sx and transition to aquatics.  OBJECTIVE IMPAIRMENTS: decreased activity tolerance, decreased ROM, decreased strength, hypomobility, increased muscle spasms, impaired flexibility, and pain.   ACTIVITY LIMITATIONS: carrying, bending, sitting, standing, squatting, sleeping, and bed mobility  PARTICIPATION LIMITATIONS: laundry, shopping, community activity, and yard work  PERSONAL FACTORS: Behavior pattern, Past/current experiences, Time since onset of injury/illness/exacerbation, and 1-2 comorbidities: recent R TKA revision,HTN  are also affecting patient's functional outcome.   REHAB POTENTIAL: Fair    CLINICAL DECISION MAKING: Stable/uncomplicated  EVALUATION COMPLEXITY: Low   GOALS: Goals reviewed with patient? Yes  SHORT TERM GOALS: Target date: 2 weeks through 02/13/23  I HEP Baseline:initiated  Goal status: INITIAL  LONG TERM GOALS: Target date: 04/24/23  Modified oswestry impove to 19/50 Baseline: 29/50 Goal status: INITIAL  2.  Improve R hamstrings flexibility to -30 Baseline: -60 Goal status: INITIAL  3.  Improve 30 sec sit to stand from  7 to12 Baseline: 7 Goal status: INITIAL   PLAN:  PT FREQUENCY: 2x/week  PT DURATION: 12 weeks  PLANNED INTERVENTIONS: Therapeutic  exercises, Therapeutic activity, Neuromuscular re-education, Balance training, Gait training, Patient/Family education, Self Care, and Joint mobilization.aquatic PT  PLAN FOR NEXT SESSION: reassess for dry needling or other pain mangement modalities, add more LS stretching, therex   Kallum Jorgensen L Shivaan Tierno, PT 01/30/2023, 6:58 PM

## 2023-02-01 ENCOUNTER — Other Ambulatory Visit: Payer: Self-pay

## 2023-02-01 ENCOUNTER — Ambulatory Visit: Payer: BC Managed Care – PPO

## 2023-02-01 DIAGNOSIS — R262 Difficulty in walking, not elsewhere classified: Secondary | ICD-10-CM

## 2023-02-01 DIAGNOSIS — M5442 Lumbago with sciatica, left side: Secondary | ICD-10-CM | POA: Diagnosis not present

## 2023-02-01 DIAGNOSIS — G8929 Other chronic pain: Secondary | ICD-10-CM

## 2023-02-01 NOTE — Therapy (Signed)
OUTPATIENT PHYSICAL THERAPY THORACOLUMBAR TREATMENT   Patient Name: Micheal Lucas MRN: [id number] DOB:[birth year], 67 y.o., male Today's Date: 02/01/2023  END OF SESSION:  PT End of Session - 02/01/23 0845     Visit Number 2    Date for PT Re-Evaluation 04/24/23    Progress Note Due on Visit 10    PT Start Time 0800    PT Stop Time 0845    PT Time Calculation (min) 45 min    Activity Tolerance Patient tolerated treatment well    Behavior During Therapy [facility] for tasks assessed/performed              Past Medical History:  Diagnosis Date   Anemia    Arthritis    oa   Bruit of right carotid artery    Cataract    right eye   Elevated cholesterol    GERD (gastroesophageal reflux disease)    Hypertension    Plantar fascial fibromatosis    Past Surgical History:  Procedure Laterality Date   colonscopy  age 84   EYE SURGERY Left 04/10/2016   cataract surgery   EYE SURGERY Bilateral 2015   stents   knee scope and cartlidge implant Left 2009   TOTAL KNEE ARTHROPLASTY Right 05/23/2016   Procedure: RIGHT TOTAL KNEE ARTHROPLASTY;  Surgeon: Durene Romans, MD;  Location: [facility];  Service: Orthopedics;  Laterality: Right;  Adductor block   TOTAL KNEE REVISION Right 10/19/2022   Procedure: TOTAL KNEE REVISION;  Surgeon: Durene Romans, MD;  Location: [facility];  Service: Orthopedics;  Laterality: Right;   Patient Active Problem List   Diagnosis Date Noted   S/P revision of total knee, 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   Obese 05/24/2016   S/P right TKA 05/23/2016   S/P knee replacement 05/23/2016    PCP: Mattie Marlin , MD  REFERRING PROVIDER: Venita Lick  REFERRING DIAG: chronic lower back pain  Rationale for Evaluation and Treatment: Rehabilitation  THERAPY DIAG:  Chronic bilateral low back pain with bilateral sciatica  Difficulty in walking, not elsewhere classified  ONSET DATE: 4 years, since 2020  SUBJECTIVE:                                                                                                                                                                                            SUBJECTIVE STATEMENT: Noted some improvement in sitting tolerance after initial visit PERTINENT HISTORY:  Chronic lower back pain, revision R TKA 3 months ago.  Recovered from TKA, now referred to PT , MD specifically requested AQUATIC PT  PAIN:  Are you having pain? Yes: NPRS scale: 4/10 Pain location: lower back,  B lateral thighs Pain description: constant unyielding Aggravating factors: activity, sleeping Relieving factors: none  PRECAUTIONS: None  WEIGHT BEARING RESTRICTIONS: No  FALLS:  Has patient fallen in last 6 months? No  LIVING ENVIRONMENT: Lives with: lives with their spouse Lives in: House/apartment Stairs: Yes: External: 2 steps; on left going up Has following equipment at home: Single point cane and Walker - 2 wheeled  OCCUPATION: retired  PLOF: Independent  PATIENT GOALS: Improve my mobility, gait, pain level, be able to sleep  NEXT MD VISIT: unclear  OBJECTIVE:   DIAGNOSTIC FINDINGS: MRI  May 2024:  IMPRESSION: 1. At L4-L5 there is moderate bilateral subarticular recess stenosis without substantial central canal stenosis. 2. Mild foraminal stenosis bilaterally at L3-L4 and L4-L5.  PATIENT SURVEYS:  Modified Oswestry 29/50   SCREENING FOR RED FLAGS: Bowel or bladder incontinence: No Spinal tumors: No Cauda equina syndrome: No Compression fracture: No Abdominal aneurysm: No  COGNITION: Overall cognitive status: Within functional limits for tasks assessed     SENSATION: WFL  MUSCLE LENGTH: Hamstrings: Right -55 deg; Left wnl deg Prone knee flexion : Right -10 deg; Left -10 deg, reproduction of pain L$/5 region with overpressure   POSTURE: weight shift L, R knee mildly flexed  PALPATION: Tender , painful, reproduction of pain central segmental PA 's lower thoracic spine l T 9,to entire  LS spine,spinous processes Pain B  glut medius, maximus  LUMBAR ROM:   AROM eval  Flexion 75%  Extension 25%  Right lateral flexion 25%  Left lateral flexion 25%  Right rotation   Left rotation    (Blank rows = not tested)  LOWER EXTREMITY ROM:     All LE ROM wfl LOWER EXTREMITY MMT:    MMT Right eval Left eval  Hip flexion    Hip extension 4- 4-  Hip abduction 4- 4-  Hip adduction    Hip internal rotation    Hip external rotation    Knee flexion    Knee extension 4-   Ankle dorsiflexion HW wnl "  Ankle plantarflexion TW wnl "  Ankle inversion    Ankle eversion     (Blank rows = not tested)  LUMBAR SPECIAL TESTS:  Straight leg raise test: Positive, Slump test: Negative, and prone knee bend (L4) neural stretch with + LBP, familiar sign  FUNCTIONAL TESTS:  30 seconds chair stand test  7  GAIT: Distance walked: over 100' in clinic TODAY'S TREATMENT:                                                                                                                              DATE: 01/31/23: Manual:  Prone for deep cross friction massage R hamstring muscle belly and myofascial release utilizing massage gun and blade to improve tissue perfusion and extensibility. Trigger Point Dry-Needling  Treatment instructions: Expect mild to moderate muscle soreness. S/S of pneumothorax if dry needled over a lung field, and to seek immediate medical attention  should they occur. Patient verbalized understanding of these instructions and education. Patient Consent Given: Yes Education handout provided: Previously provided Muscles treated: L L4, L L5 multifidus , L glut medius, R vastus lateralis Electrical stimulation performed: No Parameters: N/A Treatment response/outcome: Twitch Response Elicited and Palpable Increase in Muscle Length  L side lying for muscle energy for flexion rotation side bending restriction, L lumbar region, 2 bouts 6 reps each, 5 sec holds  Therex:   Instructed in self stretch for L lumbar  facets with thoracic rotation, provided info from [employer] Instructed in R hamstring eccentric stretch/ strengthen, with firm strap, cues to maintain slight flexion r knee, with supine R hip flex/hamstring stretch and eccentric tightening hamstrings and concentric push with R hamstrings to lower his R leg 01/30/23 :  Prone for DN Trigger Point Dry-Needling  Treatment instructions: Expect mild to moderate muscle soreness. S/S of pneumothorax if dry needled over a lung field, and to seek immediate medical attention should they occur. Patient verbalized understanding of these instructions and education. Patient Consent Given: Yes Education handout provided: No Muscles treated: L glut med, L glut max 3 pts total Electrical stimulation performed: No Parameters:    Treatment response/outcome: Twitch Response Elicited   PATIENT EDUCATION:  Education details: POC, goals Person educated: Patient Education method: Explanation, Demonstration, Tactile cues, Verbal cues, and Handouts Education comprehension: verbalized understanding, returned demonstration, and verbal cues required  HOME EXERCISE PROGRAM: Access Code: [email or web address] URL: [email or web address] Date: 02/01/2023 Prepared by: Linton Rump Srihaan Mastrangelo  Exercises - Sidelying Lumbar Rotation Stretch  - 1 x daily - 7 x weekly - 3 sets - 10 reps 01/30/23: demo'd to patient self pelvic traction by suspending over bed for sacral distraction.  Also seated R hamstring stretcth with towel with hip hinge for R hamstring .    ASSESSMENT:  CLINICAL IMPRESSION: Patient is a 67 y.o. male who was seen today for physical therapy treatment for chronic lower back pain and radicular pain. He is inflamed in soft tissue and jts lower thoracic and lumbar region, and B post hips. Noted today more distinct tightness L lumbar region and R hamstring region. He responded well to dry needling also to muscle energy lumbar region with improved forward bending Ls after  these techniques.. He had some increased flexibility R hamstrings as compared to initial appt.  Will continue to benefit from skilled PT to address his deficits and to transition to aquatic PT in 3 weeks. OBJECTIVE IMPAIRMENTS: decreased activity tolerance, decreased ROM, decreased strength, hypomobility, increased muscle spasms, impaired flexibility, and pain.   ACTIVITY LIMITATIONS: carrying, bending, sitting, standing, squatting, sleeping, and bed mobility  PARTICIPATION LIMITATIONS: laundry, shopping, community activity, and yard work  PERSONAL FACTORS: Behavior pattern, Past/current experiences, Time since onset of injury/illness/exacerbation, and 1-2 comorbidities: recent R TKA revision,HTN  are also affecting patient's functional outcome.   REHAB POTENTIAL: Fair    CLINICAL DECISION MAKING: Stable/uncomplicated  EVALUATION COMPLEXITY: Low   GOALS: Goals reviewed with patient? Yes  SHORT TERM GOALS: Target date: 2 weeks through 02/13/23  I HEP Baseline:initiated  Goal status: INITIAL  LONG TERM GOALS: Target date: 04/24/23  Modified oswestry impove to 19/50 Baseline: 29/50 Goal status: INITIAL  2.  Improve R hamstrings flexibility to -30 Baseline: -60 Goal status: INITIAL  3.  Improve 30 sec sit to stand from  7 to12 Baseline: 7 Goal status: INITIAL   PLAN:  PT FREQUENCY: 2x/week  PT DURATION: 12 weeks  PLANNED INTERVENTIONS: Therapeutic exercises, Therapeutic activity, Neuromuscular re-education, Balance  training, Gait training, Patient/Family education, Self Care, and Joint mobilization.aquatic PT  PLAN FOR NEXT SESSION: reassess for dry needling or other pain mangement modalities, add more LS stretching, therex   Lucia Mccreadie L Ange Puskas, PT 02/01/2023, 10:10 AM

## 2023-02-06 ENCOUNTER — Ambulatory Visit: Payer: BC Managed Care – PPO

## 2023-02-06 DIAGNOSIS — G8929 Other chronic pain: Secondary | ICD-10-CM

## 2023-02-06 DIAGNOSIS — M5442 Lumbago with sciatica, left side: Secondary | ICD-10-CM | POA: Diagnosis not present

## 2023-02-06 DIAGNOSIS — R262 Difficulty in walking, not elsewhere classified: Secondary | ICD-10-CM

## 2023-02-06 NOTE — Therapy (Signed)
OUTPATIENT PHYSICAL THERAPY THORACOLUMBAR TREATMENT   Patient Name: Micheal Lucas MRN: [id number] DOB:1956-07-20, 67 y.o., male Today's Date: 02/06/2023  END OF SESSION:  PT End of Session - 02/06/23 0812     Visit Number 3    Date for PT Re-Evaluation 04/24/23    Progress Note Due on Visit 10    PT Start Time 0807    PT Stop Time 0846    PT Time Calculation (min) 39 min    Activity Tolerance Patient tolerated treatment well    Behavior During Therapy [facility] for tasks assessed/performed               Past Medical History:  Diagnosis Date   Anemia    Arthritis    oa   Bruit of right carotid artery    Cataract    right eye   Elevated cholesterol    GERD (gastroesophageal reflux disease)    Hypertension    Plantar fascial fibromatosis    Past Surgical History:  Procedure Laterality Date   colonscopy  age 6   EYE SURGERY Left 04/10/2016   cataract surgery   EYE SURGERY Bilateral 2015   stents   knee scope and cartlidge implant Left 2009   TOTAL KNEE ARTHROPLASTY Right 05/23/2016   Procedure: RIGHT TOTAL KNEE ARTHROPLASTY;  Surgeon: Durene Romans, MD;  Location: [facility];  Service: Orthopedics;  Laterality: Right;  Adductor block   TOTAL KNEE REVISION Right 10/19/2022   Procedure: TOTAL KNEE REVISION;  Surgeon: Durene Romans, MD;  Location: [facility];  Service: Orthopedics;  Laterality: Right;   Patient Active Problem List   Diagnosis Date Noted   S/P revision of total knee, 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   Obese 05/24/2016   S/P right TKA 05/23/2016   S/P knee replacement 05/23/2016    PCP: Mattie Marlin , MD  REFERRING PROVIDER: Venita Lick  REFERRING DIAG: chronic lower back pain  Rationale for Evaluation and Treatment: Rehabilitation  THERAPY DIAG:  Chronic bilateral low back pain with bilateral sciatica  Difficulty in walking, not elsewhere classified  ONSET DATE: 4 years, since 2020  SUBJECTIVE:                                                                                                                                                                                            SUBJECTIVE STATEMENT: Pt reports some pain today  PERTINENT HISTORY:  Chronic lower back pain, revision R TKA 3 months ago.  Recovered from TKA, now referred to PT , MD specifically requested AQUATIC PT  PAIN:  Are you having pain? Yes: NPRS scale: 3/10 Pain location: lower back, B lateral  thighs Pain description: constant unyielding Aggravating factors: activity, sleeping Relieving factors: none  PRECAUTIONS: None  WEIGHT BEARING RESTRICTIONS: No  FALLS:  Has patient fallen in last 6 months? No  LIVING ENVIRONMENT: Lives with: lives with their spouse Lives in: House/apartment Stairs: Yes: External: 2 steps; on left going up Has following equipment at home: Single point cane and Walker - 2 wheeled  OCCUPATION: retired  PLOF: Independent  PATIENT GOALS: Improve my mobility, gait, pain level, be able to sleep  NEXT MD VISIT: unclear  OBJECTIVE:   DIAGNOSTIC FINDINGS: MRI  May 2024:  IMPRESSION: 1. At L4-L5 there is moderate bilateral subarticular recess stenosis without substantial central canal stenosis. 2. Mild foraminal stenosis bilaterally at L3-L4 and L4-L5.  PATIENT SURVEYS:  Modified Oswestry 29/50   SCREENING FOR RED FLAGS: Bowel or bladder incontinence: No Spinal tumors: No Cauda equina syndrome: No Compression fracture: No Abdominal aneurysm: No  COGNITION: Overall cognitive status: Within functional limits for tasks assessed     SENSATION: WFL  MUSCLE LENGTH: Hamstrings: Right -55 deg; Left wnl deg Prone knee flexion : Right -10 deg; Left -10 deg, reproduction of pain L$/5 region with overpressure   POSTURE: weight shift L, R knee mildly flexed  PALPATION: Tender , painful, reproduction of pain central segmental PA 's lower thoracic spine l T 9,to entire  LS spine,spinous processes Pain B glut medius, maximus  LUMBAR  ROM:   AROM eval  Flexion 75%  Extension 25%  Right lateral flexion 25%  Left lateral flexion 25%  Right rotation   Left rotation    (Blank rows = not tested)  LOWER EXTREMITY ROM:     All LE ROM wfl LOWER EXTREMITY MMT:    MMT Right eval Left eval  Hip flexion    Hip extension 4- 4-  Hip abduction 4- 4-  Hip adduction    Hip internal rotation    Hip external rotation    Knee flexion    Knee extension 4-   Ankle dorsiflexion HW wnl "  Ankle plantarflexion TW wnl "  Ankle inversion    Ankle eversion     (Blank rows = not tested)  LUMBAR SPECIAL TESTS:  Straight leg raise test: Positive, Slump test: Negative, and prone knee bend (L4) neural stretch with + LBP, familiar sign  FUNCTIONAL TESTS:  30 seconds chair stand test  7  GAIT: Distance walked: over 100' in clinic TODAY'S TREATMENT:                                                                                                                              DATE:  02/06/23 Therapeutic Exercise: to improve strength and mobility.  Demo, verbal and tactile cues throughout for technique.  Rec Bike L1x88min Supine LTR x 10 bil Supine pelvic tilts x 10  Supine hamstring stretch with strap 2x30 secs bil Supine figure 4 stretch and KTOS piriformis stretch 2x30 sec bil Bridge with GTB 2x10 with  5 seconds hold S/L clamshells GTB x 10 each side Supine march with TrA 5x bil;  01/31/23: Manual:  Prone for deep cross friction massage R hamstring muscle belly and myofascial release utilizing massage gun and blade to improve tissue perfusion and extensibility. Trigger Point Dry-Needling  Treatment instructions: Expect mild to moderate muscle soreness. S/S of pneumothorax if dry needled over a lung field, and to seek immediate medical attention should they occur. Patient verbalized understanding of these instructions and education. Patient Consent Given: Yes Education handout provided: Previously provided Muscles treated: L L4,  L L5 multifidus , L glut medius, R vastus lateralis Electrical stimulation performed: No Parameters: N/A Treatment response/outcome: Twitch Response Elicited and Palpable Increase in Muscle Length  L side lying for muscle energy for flexion rotation side bending restriction, L lumbar region, 2 bouts 6 reps each, 5 sec holds  Therex:   Instructed in self stretch for L lumbar facets with thoracic rotation, provided info from [employer] Instructed in R hamstring eccentric stretch/ strengthen, with firm strap, cues to maintain slight flexion r knee, with supine R hip flex/hamstring stretch and eccentric tightening hamstrings and concentric push with R hamstrings to lower his R leg 01/30/23 :  Prone for DN Trigger Point Dry-Needling  Treatment instructions: Expect mild to moderate muscle soreness. S/S of pneumothorax if dry needled over a lung field, and to seek immediate medical attention should they occur. Patient verbalized understanding of these instructions and education. Patient Consent Given: Yes Education handout provided: No Muscles treated: L glut med, L glut max 3 pts total Electrical stimulation performed: No Parameters:    Treatment response/outcome: Twitch Response Elicited   PATIENT EDUCATION:  Education details: HEP update Person educated: Patient Education method: Explanation, Demonstration, Tactile cues, Verbal cues, and Handouts Education comprehension: verbalized understanding, returned demonstration, and verbal cues required  HOME EXERCISE PROGRAM: Access Code: [email or web address] URL: [email or web address] Date: 02/06/2023 Prepared by: Verta Ellen  Exercises - Sidelying Lumbar Rotation Stretch  - 1 x daily - 7 x weekly - 3 sets - 10 reps - Supine Pelvic Tilt  - 1 x daily - 7 x weekly - 3 sets - 10 reps - Supine Figure 4 Piriformis Stretch  - 1 x daily - 7 x weekly - 3 sets - 30 sec hold - Supine Bridge with Resistance Band  - 1 x daily - 7 x weekly - 2-3 sets -  10 reps - Clamshell with Resistance  - 1 x daily - 7 x weekly - 2-3 sets - 10 reps 01/30/23: demo'd to patient self pelvic traction by suspending over bed for sacral distraction.  Also seated R hamstring stretcth with towel with hip hinge for R hamstring .    ASSESSMENT:  CLINICAL IMPRESSION: Patient is a 67 y.o. male who was seen today for physical therapy treatment for chronic lower back pain and radicular pain. Pt arrived with improved pain in B hips and low back. Today we focused on more lumbo-pelvic exercises mainly stretching to improve flexibility. The L side was more tight with the figure 4 stretch. Cues needed to engage core with bridging. Pt starts aquatic therapy next week. Will continue to benefit from skilled PT to address his deficits and to transition to aquatic PT. OBJECTIVE IMPAIRMENTS: decreased activity tolerance, decreased ROM, decreased strength, hypomobility, increased muscle spasms, impaired flexibility, and pain.   ACTIVITY LIMITATIONS: carrying, bending, sitting, standing, squatting, sleeping, and bed mobility  PARTICIPATION LIMITATIONS: laundry, shopping, community activity, and yard work  PERSONAL FACTORS: Behavior pattern,  Past/current experiences, Time since onset of injury/illness/exacerbation, and 1-2 comorbidities: recent R TKA revision,HTN  are also affecting patient's functional outcome.   REHAB POTENTIAL: Fair    CLINICAL DECISION MAKING: Stable/uncomplicated  EVALUATION COMPLEXITY: Low   GOALS: Goals reviewed with patient? Yes  SHORT TERM GOALS: Target date: 2 weeks through 02/13/23  I HEP Baseline:initiated  Goal status: INITIAL  LONG TERM GOALS: Target date: 04/24/23  Modified oswestry impove to 19/50 Baseline: 29/50 Goal status: INITIAL  2.  Improve R hamstrings flexibility to -30 Baseline: -60 Goal status: INITIAL  3.  Improve 30 sec sit to stand from  7 to12 Baseline: 7 Goal status: INITIAL   PLAN:  PT FREQUENCY: 2x/week  PT  DURATION: 12 weeks  PLANNED INTERVENTIONS: Therapeutic exercises, Therapeutic activity, Neuromuscular re-education, Balance training, Gait training, Patient/Family education, Self Care, and Joint mobilization.aquatic PT  PLAN FOR NEXT SESSION: aquatic therapy; reassess for dry needling or other pain mangement modalities, add more LS stretching, therex   Darleene Cleaver, PTA 02/06/2023, 8:56 AM

## 2023-02-21 ENCOUNTER — Encounter (HOSPITAL_BASED_OUTPATIENT_CLINIC_OR_DEPARTMENT_OTHER): Payer: Self-pay | Admitting: Physical Therapy

## 2023-02-21 ENCOUNTER — Ambulatory Visit (HOSPITAL_BASED_OUTPATIENT_CLINIC_OR_DEPARTMENT_OTHER): Payer: BC Managed Care – PPO | Attending: Orthopedic Surgery | Admitting: Physical Therapy

## 2023-02-21 DIAGNOSIS — G8929 Other chronic pain: Secondary | ICD-10-CM | POA: Insufficient documentation

## 2023-02-21 DIAGNOSIS — R262 Difficulty in walking, not elsewhere classified: Secondary | ICD-10-CM | POA: Diagnosis present

## 2023-02-21 DIAGNOSIS — M5441 Lumbago with sciatica, right side: Secondary | ICD-10-CM | POA: Diagnosis present

## 2023-02-21 DIAGNOSIS — M5442 Lumbago with sciatica, left side: Secondary | ICD-10-CM | POA: Diagnosis present

## 2023-02-21 NOTE — Therapy (Signed)
OUTPATIENT PHYSICAL THERAPY THORACOLUMBAR TREATMENT   Patient Name: Micheal Lucas MRN: [id number] DOB:1956-04-21, 67 y.o., male Today's Date: 02/21/2023  END OF SESSION:  PT End of Session - 02/21/23 0817     Visit Number 4    Date for PT Re-Evaluation 04/24/23    Progress Note Due on Visit 10    PT Start Time 0816    PT Stop Time 0855    PT Time Calculation (min) 39 min               Past Medical History:  Diagnosis Date   Anemia    Arthritis    oa   Bruit of right carotid artery    Cataract    right eye   Elevated cholesterol    GERD (gastroesophageal reflux disease)    Hypertension    Plantar fascial fibromatosis    Past Surgical History:  Procedure Laterality Date   colonscopy  age 23   EYE SURGERY Left 04/10/2016   cataract surgery   EYE SURGERY Bilateral 2015   stents   knee scope and cartlidge implant Left 2009   TOTAL KNEE ARTHROPLASTY Right 05/23/2016   Procedure: RIGHT TOTAL KNEE ARTHROPLASTY;  Surgeon: Durene Romans, MD;  Location: [facility];  Service: Orthopedics;  Laterality: Right;  Adductor block   TOTAL KNEE REVISION Right 10/19/2022   Procedure: TOTAL KNEE REVISION;  Surgeon: Durene Romans, MD;  Location: [facility];  Service: Orthopedics;  Laterality: Right;   Patient Active Problem List   Diagnosis Date Noted   S/P revision of total knee, 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   Obese 05/24/2016   S/P right TKA 05/23/2016   S/P knee replacement 05/23/2016    PCP: Mattie Marlin , MD  REFERRING PROVIDER: Venita Lick  REFERRING DIAG: chronic lower back pain  Rationale for Evaluation and Treatment: Rehabilitation  THERAPY DIAG:  Chronic bilateral low back pain with bilateral sciatica  Difficulty in walking, not elsewhere classified  ONSET DATE: 4 years, since 2020  SUBJECTIVE:                                                                                                                                                                                            SUBJECTIVE STATEMENT: Pt reports he rides stationary bike 15 min every morning, along with HEP.  Exercise temporarily relieve pain, to 3/10.   PERTINENT HISTORY:  Chronic lower back pain, revision R TKA 3 months ago.  Recovered from TKA, now referred to PT , MD specifically requested AQUATIC PT  PAIN:  Are you having pain? Yes: NPRS scale: 7/10 Pain location: lower back radiating to B lateral thighs  to knees Pain description: constant unyielding Aggravating factors: activity, sleeping Relieving factors: none  PRECAUTIONS: None  WEIGHT BEARING RESTRICTIONS: No  FALLS:  Has patient fallen in last 6 months? No  LIVING ENVIRONMENT: Lives with: lives with their spouse Lives in: House/apartment Stairs: Yes: External: 2 steps; on left going up Has following equipment at home: Single point cane and Walker - 2 wheeled  OCCUPATION: retired  PLOF: Independent  PATIENT GOALS: Improve my mobility, gait, pain level, be able to sleep  NEXT MD VISIT: unclear  OBJECTIVE:   DIAGNOSTIC FINDINGS: MRI  May 2024:  IMPRESSION: 1. At L4-L5 there is moderate bilateral subarticular recess stenosis without substantial central canal stenosis. 2. Mild foraminal stenosis bilaterally at L3-L4 and L4-L5.  PATIENT SURVEYS:  Modified Oswestry 29/50   SCREENING FOR RED FLAGS: Bowel or bladder incontinence: No Spinal tumors: No Cauda equina syndrome: No Compression fracture: No Abdominal aneurysm: No  COGNITION: Overall cognitive status: Within functional limits for tasks assessed     SENSATION: WFL  MUSCLE LENGTH: Hamstrings: Right -55 deg; Left wnl deg Prone knee flexion : Right -10 deg; Left -10 deg, reproduction of pain L$/5 region with overpressure   POSTURE: weight shift L, R knee mildly flexed  PALPATION: Tender , painful, reproduction of pain central segmental PA 's lower thoracic spine l T 9,to entire  LS spine,spinous processes Pain B glut medius, maximus  LUMBAR ROM:    AROM eval  Flexion 75%  Extension 25%  Right lateral flexion 25%  Left lateral flexion 25%  Right rotation   Left rotation    (Blank rows = not tested)  LOWER EXTREMITY ROM:     All LE ROM wfl LOWER EXTREMITY MMT:    MMT Right eval Left eval  Hip flexion    Hip extension 4- 4-  Hip abduction 4- 4-  Hip adduction    Hip internal rotation    Hip external rotation    Knee flexion    Knee extension 4-   Ankle dorsiflexion HW wnl "  Ankle plantarflexion TW wnl "  Ankle inversion    Ankle eversion     (Blank rows = not tested)  LUMBAR SPECIAL TESTS:  Straight leg raise test: Positive, Slump test: Negative, and prone knee bend (L4) neural stretch with + LBP, familiar sign  FUNCTIONAL TESTS:  30 seconds chair stand test  7  GAIT: Distance walked: over 100' in clinic TODAY'S TREATMENT:                                                                                                                              DATE:  02/21/23 Pt seen for aquatic therapy today.  Treatment took place in water 3.5-4.75 ft in depth at the [facility] pool. Temp of water was 91.  Pt entered/exited the pool via stairs independently with bilat rail. * intro to aquatic therapy principles * in 4+ft of water, no UE support:  walking forward/  backward, then adding reciprocal arm swing;   * side stepping with arm addct, then with rainbow hand floats  * farmer carry, with single/ bilat rainbow hand floats under water with TrA set, walking forward/ backward * suspended cycling forward (yellow noodle under arms/  yellow noodle between legs)  * TrA set with solid noodle pull down to thighs x 10 * 3 way LE stretch with solid noodle at ankle x 2 reps each LE  Pt requires the buoyancy and hydrostatic pressure of water for support, and to offload joints by unweighting joint load by at least 50 % in navel deep water and by at least 75-80% in chest to neck deep water.  Viscosity of the water is needed  for resistance of strengthening. Water current perturbations provides challenge to standing balance requiring increased core activation.   02/06/23 Therapeutic Exercise: to improve strength and mobility.  Demo, verbal and tactile cues throughout for technique.  Rec Bike L1x43min Supine LTR x 10 bil Supine pelvic tilts x 10  Supine hamstring stretch with strap 2x30 secs bil Supine figure 4 stretch and KTOS piriformis stretch 2x30 sec bil Bridge with GTB 2x10 with 5 seconds hold S/L clamshells GTB x 10 each side Supine march with TrA 5x bil;  01/31/23: Manual:  Prone for deep cross friction massage R hamstring muscle belly and myofascial release utilizing massage gun and blade to improve tissue perfusion and extensibility. Trigger Point Dry-Needling  Treatment instructions: Expect mild to moderate muscle soreness. S/S of pneumothorax if dry needled over a lung field, and to seek immediate medical attention should they occur. Patient verbalized understanding of these instructions and education. Patient Consent Given: Yes Education handout provided: Previously provided Muscles treated: L L4, L L5 multifidus , L glut medius, R vastus lateralis Electrical stimulation performed: No Parameters: N/A Treatment response/outcome: Twitch Response Elicited and Palpable Increase in Muscle Length  L side lying for muscle energy for flexion rotation side bending restriction, L lumbar region, 2 bouts 6 reps each, 5 sec holds  Therex:   Instructed in self stretch for L lumbar facets with thoracic rotation, provided info from medbirdge Instructed in R hamstring eccentric stretch/ strengthen, with firm strap, cues to maintain slight flexion r knee, with supine R hip flex/hamstring stretch and eccentric tightening hamstrings and concentric push with R hamstrings to lower his R leg 01/30/23 :  Prone for DN Trigger Point Dry-Needling  Treatment instructions: Expect mild to moderate muscle soreness. S/S of  pneumothorax if dry needled over a lung field, and to seek immediate medical attention should they occur. Patient verbalized understanding of these instructions and education. Patient Consent Given: Yes Education handout provided: No Muscles treated: L glut med, L glut max 3 pts total Electrical stimulation performed: No Parameters:    Treatment response/outcome: Twitch Response Elicited   PATIENT EDUCATION:  Education details: aquatic therapy intro  Person educated: Patient Education method: Explanation, Demonstration, Actor cues, Verbal cues Education comprehension: verbalized understanding, returned demonstration, and verbal cues required  HOME EXERCISE PROGRAM: Access Code: [email or web address] URL: [email or web address] Date: 02/06/2023 Prepared by: Verta Ellen  Exercises - Sidelying Lumbar Rotation Stretch  - 1 x daily - 7 x weekly - 3 sets - 10 reps - Supine Pelvic Tilt  - 1 x daily - 7 x weekly - 3 sets - 10 reps - Supine Figure 4 Piriformis Stretch  - 1 x daily - 7 x weekly - 3 sets - 30 sec hold - Supine Bridge with Resistance Band  -  1 x daily - 7 x weekly - 2-3 sets - 10 reps - Clamshell with Resistance  - 1 x daily - 7 x weekly - 2-3 sets - 10 reps 01/30/23: demo'd to patient self pelvic traction by suspending over bed for sacral distraction.  Also seated R hamstring stretcth with towel with hip hinge for R hamstring .    ASSESSMENT:  CLINICAL IMPRESSION: Pt is safe and independent in aquatic setting; able to take direction from therapist on deck. Pt reported gradual reduction of pain (to 4/10, and centralized) during session.  Will plan to progress to tolerance. Pt to look into area pools in [place] that accept his insurance and report back. Will plan to create HEP and foster independence with those exercises. Pt will continue to benefit from skilled PT to address his deficits and improve his mobility.   OBJECTIVE IMPAIRMENTS: decreased activity tolerance, decreased  ROM, decreased strength, hypomobility, increased muscle spasms, impaired flexibility, and pain.   ACTIVITY LIMITATIONS: carrying, bending, sitting, standing, squatting, sleeping, and bed mobility  PARTICIPATION LIMITATIONS: laundry, shopping, community activity, and yard work  PERSONAL FACTORS: Behavior pattern, Past/current experiences, Time since onset of injury/illness/exacerbation, and 1-2 comorbidities: recent R TKA revision,HTN  are also affecting patient's functional outcome.   REHAB POTENTIAL: Fair    CLINICAL DECISION MAKING: Stable/uncomplicated  EVALUATION COMPLEXITY: Low   GOALS: Goals reviewed with patient? Yes  SHORT TERM GOALS: Target date: 2 weeks through 02/13/23  I HEP Baseline:initiated  Goal status: INITIAL  LONG TERM GOALS: Target date: 04/24/23  Modified oswestry impove to 19/50 Baseline: 29/50 Goal status: INITIAL  2.  Improve R hamstrings flexibility to -30 Baseline: -60 Goal status: INITIAL  3.  Improve 30 sec sit to stand from  7 to12 Baseline: 7 Goal status: INITIAL   PLAN:  PT FREQUENCY: 2x/week  PT DURATION: 12 weeks  PLANNED INTERVENTIONS: Therapeutic exercises, Therapeutic activity, Neuromuscular re-education, Balance training, Gait training, Patient/Family education, Self Care, and Joint mobilization.aquatic PT  PLAN FOR NEXT SESSION: aquatic therapy; reassess for dry needling or other pain mangement modalities, add more LS stretching, therex.    Mayer Camel, PTA 02/21/23 9:03 AM [facility] [address] [place], [place], [address] Phone: [phone]   Fax:  [phone]

## 2023-02-26 ENCOUNTER — Ambulatory Visit (HOSPITAL_BASED_OUTPATIENT_CLINIC_OR_DEPARTMENT_OTHER): Payer: BC Managed Care – PPO | Admitting: Physical Therapy

## 2023-02-26 ENCOUNTER — Encounter (HOSPITAL_BASED_OUTPATIENT_CLINIC_OR_DEPARTMENT_OTHER): Payer: Self-pay | Admitting: Physical Therapy

## 2023-02-26 DIAGNOSIS — G8929 Other chronic pain: Secondary | ICD-10-CM

## 2023-02-26 DIAGNOSIS — R262 Difficulty in walking, not elsewhere classified: Secondary | ICD-10-CM

## 2023-02-26 DIAGNOSIS — M5442 Lumbago with sciatica, left side: Secondary | ICD-10-CM | POA: Diagnosis not present

## 2023-02-26 NOTE — Therapy (Signed)
OUTPATIENT PHYSICAL THERAPY THORACOLUMBAR TREATMENT   Patient Name: Micheal Lucas MRN: [id number] DOB:1956/04/30, 67 y.o., male Today's Date: 02/26/2023  END OF SESSION:  PT End of Session - 02/26/23 0838     Visit Number 5    Date for PT Re-Evaluation 04/24/23    PT Start Time 0817    PT Stop Time 0855    PT Time Calculation (min) 38 min    Activity Tolerance Patient tolerated treatment well    Behavior During Therapy [facility] for tasks assessed/performed               Past Medical History:  Diagnosis Date   Anemia    Arthritis    oa   Bruit of right carotid artery    Cataract    right eye   Elevated cholesterol    GERD (gastroesophageal reflux disease)    Hypertension    Plantar fascial fibromatosis    Past Surgical History:  Procedure Laterality Date   colonscopy  age 20   EYE SURGERY Left 04/10/2016   cataract surgery   EYE SURGERY Bilateral 2015   stents   knee scope and cartlidge implant Left 2009   TOTAL KNEE ARTHROPLASTY Right 05/23/2016   Procedure: RIGHT TOTAL KNEE ARTHROPLASTY;  Surgeon: Durene Romans, MD;  Location: [facility];  Service: Orthopedics;  Laterality: Right;  Adductor block   TOTAL KNEE REVISION Right 10/19/2022   Procedure: TOTAL KNEE REVISION;  Surgeon: Durene Romans, MD;  Location: [facility];  Service: Orthopedics;  Laterality: Right;   Patient Active Problem List   Diagnosis Date Noted   S/P revision of total knee, 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   Obese 05/24/2016   S/P right TKA 05/23/2016   S/P knee replacement 05/23/2016    PCP: Mattie Marlin , MD  REFERRING PROVIDER: Venita Lick  REFERRING DIAG: chronic lower back pain  Rationale for Evaluation and Treatment: Rehabilitation  THERAPY DIAG:  Chronic bilateral low back pain with bilateral sciatica  Difficulty in walking, not elsewhere classified  ONSET DATE: 4 years, since 2020  SUBJECTIVE:                                                                                                                                                                                            SUBJECTIVE STATEMENT: Pt reports that he was sore after last session (in back of legs).  He reports that a few of his HEP exercises hurt his back.  He has not looked at list of pools yet.  He reports he was doing some lifting around house (window air unit) and he knee swelled some; treated with ice.  PERTINENT HISTORY:  Chronic lower back pain, revision R TKA 3 months ago.  Recovered from TKA, now referred to PT , MD specifically requested AQUATIC PT  PAIN:  Are you having pain? Yes: NPRS scale: 6/10 Pain location: lower back radiating to B lateral thighs to knees Pain description: constant unyielding Aggravating factors: activity, sleeping Relieving factors: none  PRECAUTIONS: None  WEIGHT BEARING RESTRICTIONS: No  FALLS:  Has patient fallen in last 6 months? No  LIVING ENVIRONMENT: Lives with: lives with their spouse Lives in: House/apartment Stairs: Yes: External: 2 steps; on left going up Has following equipment at home: Single point cane and Walker - 2 wheeled  OCCUPATION: retired  PLOF: Independent  PATIENT GOALS: Improve my mobility, gait, pain level, be able to sleep  NEXT MD VISIT: unclear  OBJECTIVE:   DIAGNOSTIC FINDINGS: MRI  May 2024:  IMPRESSION: 1. At L4-L5 there is moderate bilateral subarticular recess stenosis without substantial central canal stenosis. 2. Mild foraminal stenosis bilaterally at L3-L4 and L4-L5.  PATIENT SURVEYS:  Modified Oswestry 29/50   SCREENING FOR RED FLAGS: Bowel or bladder incontinence: No Spinal tumors: No Cauda equina syndrome: No Compression fracture: No Abdominal aneurysm: No  COGNITION: Overall cognitive status: Within functional limits for tasks assessed     SENSATION: WFL  MUSCLE LENGTH: Hamstrings: Right -55 deg; Left wnl deg Prone knee flexion : Right -10 deg; Left -10 deg, reproduction of pain L$/5 region with  overpressure   POSTURE: weight shift L, R knee mildly flexed  PALPATION: Tender , painful, reproduction of pain central segmental PA 's lower thoracic spine l T 9,to entire  LS spine,spinous processes Pain B glut medius, maximus  LUMBAR ROM:   AROM eval  Flexion 75%  Extension 25%  Right lateral flexion 25%  Left lateral flexion 25%  Right rotation   Left rotation    (Blank rows = not tested)  LOWER EXTREMITY ROM:     All LE ROM wfl LOWER EXTREMITY MMT:    MMT Right eval Left eval  Hip flexion    Hip extension 4- 4-  Hip abduction 4- 4-  Hip adduction    Hip internal rotation    Hip external rotation    Knee flexion    Knee extension 4-   Ankle dorsiflexion HW wnl "  Ankle plantarflexion TW wnl "  Ankle inversion    Ankle eversion     (Blank rows = not tested)  LUMBAR SPECIAL TESTS:  Straight leg raise test: Positive, Slump test: Negative, and prone knee bend (L4) neural stretch with + LBP, familiar sign  FUNCTIONAL TESTS:  30 seconds chair stand test  7  GAIT: Distance walked: over 100' in clinic TODAY'S TREATMENT:                                                                                                                              DATE:  02/26/23 * reviewed HEP verbally (and with therapist  demo)- minor modifications made   Pt seen for aquatic therapy today.  Treatment took place in water 3.5-4.75 ft in depth at the [facility] pool. Temp of water was 86-91.  Pt entered/exited the pool via stairs independently with bilat rail. * intro to aquatic therapy principles * in 4+ft of water, no UE support:  walking forward/ backward, then adding reciprocal arm swing;   * side stepping with arm addct, then with yellow hand floats -> side step into wide squat  * in therapy pool -> lap pool :* suspended cycling forward (yellow noodle under arms/  yellow noodle between legs) breast stroke arms * TrA set with solid noodle pull down to thighs x 10 *  holding wall:  alternating single leg clams ; leg swings into hip flex/ext; hip abdct/ addct x 10 each * forward walking kicks (hip flex to LAQ) * L stretch in shallow water, gentle wag the tail   02/21/23 Pt seen for aquatic therapy today.  Treatment took place in water 3.5-4.75 ft in depth at the [facility] pool. Temp of water was 91.  Pt entered/exited the pool via stairs independently with bilat rail. * intro to aquatic therapy principles * in 4+ft of water, no UE support:  walking forward/ backward, then adding reciprocal arm swing;   * side stepping with arm addct, then with rainbow hand floats  * farmer carry, with single/ bilat rainbow hand floats under water with TrA set, walking forward/ backward * suspended cycling forward (yellow noodle under arms/  yellow noodle between legs)  * TrA set with solid noodle pull down to thighs x 10 * 3 way LE stretch with solid noodle at ankle x 2 reps each LE  Pt requires the buoyancy and hydrostatic pressure of water for support, and to offload joints by unweighting joint load by at least 50 % in navel deep water and by at least 75-80% in chest to neck deep water.  Viscosity of the water is needed for resistance of strengthening. Water current perturbations provides challenge to standing balance requiring increased core activation.   02/06/23 Therapeutic Exercise: to improve strength and mobility.  Demo, verbal and tactile cues throughout for technique.  Rec Bike L1x82min Supine LTR x 10 bil Supine pelvic tilts x 10  Supine hamstring stretch with strap 2x30 secs bil Supine figure 4 stretch and KTOS piriformis stretch 2x30 sec bil Bridge with GTB 2x10 with 5 seconds hold S/L clamshells GTB x 10 each side Supine march with TrA 5x bil;  01/31/23: Manual:  Prone for deep cross friction massage R hamstring muscle belly and myofascial release utilizing massage gun and blade to improve tissue perfusion and extensibility. Trigger Point  Dry-Needling  Treatment instructions: Expect mild to moderate muscle soreness. S/S of pneumothorax if dry needled over a lung field, and to seek immediate medical attention should they occur. Patient verbalized understanding of these instructions and education. Patient Consent Given: Yes Education handout provided: Previously provided Muscles treated: L L4, L L5 multifidus , L glut medius, R vastus lateralis Electrical stimulation performed: No Parameters: N/A Treatment response/outcome: Twitch Response Elicited and Palpable Increase in Muscle Length  L side lying for muscle energy for flexion rotation side bending restriction, L lumbar region, 2 bouts 6 reps each, 5 sec holds  Therex:   Instructed in self stretch for L lumbar facets with thoracic rotation, provided info from [facility]ge Instructed in R hamstring eccentric stretch/ strengthen, with firm strap, cues to maintain slight flexion r knee,  with supine R hip flex/hamstring stretch and eccentric tightening hamstrings and concentric push with R hamstrings to lower his R leg 01/30/23 :  Prone for DN Trigger Point Dry-Needling  Treatment instructions: Expect mild to moderate muscle soreness. S/S of pneumothorax if dry needled over a lung field, and to seek immediate medical attention should they occur. Patient verbalized understanding of these instructions and education. Patient Consent Given: Yes Education handout provided: No Muscles treated: L glut med, L glut max 3 pts total Electrical stimulation performed: No Parameters:    Treatment response/outcome: Twitch Response Elicited   PATIENT EDUCATION:  Education details: aquatic therapy intro  Person educated: Patient Education method: Explanation, Demonstration, Actor cues, Verbal cues Education comprehension: verbalized understanding, returned demonstration, and verbal cues required  HOME EXERCISE PROGRAM: Access Code: [email or web address] URL: [email or web address] Date:  02/06/2023 Prepared by: Verta Ellen  Exercises - Supine Figure 4 Piriformis Stretch  - 1 x daily - 7 x weekly - 3 sets - 30 sec hold - Supine Bridge with Resistance Band  - 1 x daily - 7 x weekly - 2-3 sets - 10 reps - Clamshell with Resistance  - 1 x daily - 7 x weekly - 2-3 sets - 10 reps 01/30/23: demo'd to patient self pelvic traction by suspending over bed for sacral distraction.   ASSESSMENT:  CLINICAL IMPRESSION: Instructed pt to eliminated rotation stretch and post pelvic tilt from HEP for now,as these are increasing his pain.  Encouraged hooklying hamstring stretch with strap instead of seated version.  Trialed cycling in lap pool in 64ft 6" water with good tolerance (due to pt's height of 6+ ft) Pt is safe and independent in aquatic setting; able to take direction from therapist on deck. Pt reported gradual reduction of pain (to 3/10, and centralized) during session.  Will plan to progress to tolerance. Pt to look into area pools in [place] that accept his insurance and report back. Will plan to create HEP and foster independence with those exercises. Pt will continue to benefit from skilled PT to address his deficits and improve his mobility.   OBJECTIVE IMPAIRMENTS: decreased activity tolerance, decreased ROM, decreased strength, hypomobility, increased muscle spasms, impaired flexibility, and pain.   ACTIVITY LIMITATIONS: carrying, bending, sitting, standing, squatting, sleeping, and bed mobility  PARTICIPATION LIMITATIONS: laundry, shopping, community activity, and yard work  PERSONAL FACTORS: Behavior pattern, Past/current experiences, Time since onset of injury/illness/exacerbation, and 1-2 comorbidities: recent R TKA revision,HTN  are also affecting patient's functional outcome.   REHAB POTENTIAL: Fair    CLINICAL DECISION MAKING: Stable/uncomplicated  EVALUATION COMPLEXITY: Low   GOALS: Goals reviewed with patient? Yes  SHORT TERM GOALS: Target date: 2 weeks through  02/13/23  I HEP Baseline:initiated  Goal status: INITIAL  LONG TERM GOALS: Target date: 04/24/23  Modified oswestry impove to 19/50 Baseline: 29/50 Goal status: INITIAL  2.  Improve R hamstrings flexibility to -30 Baseline: -60 Goal status: INITIAL  3.  Improve 30 sec sit to stand from  7 to12 Baseline: 7 Goal status: INITIAL   PLAN:  PT FREQUENCY: 2x/week  PT DURATION: 12 weeks  PLANNED INTERVENTIONS: Therapeutic exercises, Therapeutic activity, Neuromuscular re-education, Balance training, Gait training, Patient/Family education, Self Care, and Joint mobilization.aquatic PT  PLAN FOR NEXT SESSION: aquatic therapy; reassess for dry needling or other pain mangement modalities, add more LS stretching, therex.    Mayer Camel, PTA 02/26/23 9:05 AM [facility] [address] [place], [place], [address] Phone: [phone]   Fax:  [phone]  

## 2023-03-01 ENCOUNTER — Ambulatory Visit (HOSPITAL_BASED_OUTPATIENT_CLINIC_OR_DEPARTMENT_OTHER): Payer: BC Managed Care – PPO | Admitting: Physical Therapy

## 2023-03-01 ENCOUNTER — Encounter (HOSPITAL_BASED_OUTPATIENT_CLINIC_OR_DEPARTMENT_OTHER): Payer: Self-pay | Admitting: Physical Therapy

## 2023-03-01 DIAGNOSIS — M5442 Lumbago with sciatica, left side: Secondary | ICD-10-CM | POA: Diagnosis not present

## 2023-03-01 DIAGNOSIS — R262 Difficulty in walking, not elsewhere classified: Secondary | ICD-10-CM

## 2023-03-01 DIAGNOSIS — G8929 Other chronic pain: Secondary | ICD-10-CM

## 2023-03-01 NOTE — Therapy (Signed)
OUTPATIENT PHYSICAL THERAPY THORACOLUMBAR TREATMENT   Patient Name: Micheal Lucas MRN: [id number] DOB:1956/01/18, 67 y.o., male Today's Date: 03/01/2023  END OF SESSION:  PT End of Session - 03/01/23 0821     Visit Number 6    Date for PT Re-Evaluation 04/24/23    Progress Note Due on Visit 10    PT Start Time 0811    PT Stop Time 0855    PT Time Calculation (min) 44 min    Behavior During Therapy [facility] for tasks assessed/performed               Past Medical History:  Diagnosis Date   Anemia    Arthritis    oa   Bruit of right carotid artery    Cataract    right eye   Elevated cholesterol    GERD (gastroesophageal reflux disease)    Hypertension    Plantar fascial fibromatosis    Past Surgical History:  Procedure Laterality Date   colonscopy  age 82   EYE SURGERY Left 04/10/2016   cataract surgery   EYE SURGERY Bilateral 2015   stents   knee scope and cartlidge implant Left 2009   TOTAL KNEE ARTHROPLASTY Right 05/23/2016   Procedure: RIGHT TOTAL KNEE ARTHROPLASTY;  Surgeon: Durene Romans, MD;  Location: [facility];  Service: Orthopedics;  Laterality: Right;  Adductor block   TOTAL KNEE REVISION Right 10/19/2022   Procedure: TOTAL KNEE REVISION;  Surgeon: Durene Romans, MD;  Location: [facility];  Service: Orthopedics;  Laterality: Right;   Patient Active Problem List   Diagnosis Date Noted   S/P revision of total knee, 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   Obese 05/24/2016   S/P right TKA 05/23/2016   S/P knee replacement 05/23/2016    PCP: Mattie Marlin , MD  REFERRING PROVIDER: Venita Lick  REFERRING DIAG: chronic lower back pain  Rationale for Evaluation and Treatment: Rehabilitation  THERAPY DIAG:  Chronic bilateral low back pain with bilateral sciatica  Difficulty in walking, not elsewhere classified  ONSET DATE: 4 years, since 2020  SUBJECTIVE:                                                                                                                                                                                            SUBJECTIVE STATEMENT: Pt reports he switched to stretching hamstring in bed (hooklying with strap) with good success.  He iced his back after last session, and wasn't as sore.    PERTINENT HISTORY:  Chronic lower back pain, revision R TKA 3 months ago.  Recovered from TKA, now referred to PT , MD specifically requested AQUATIC PT  PAIN:  Are you having pain? Yes: NPRS scale: 2/10    (and 8/10 in Rt knee) Pain location: lower back radiating to B lateral thighs to knees Pain description: constant unyielding Aggravating factors: activity, sleeping Relieving factors: none  PRECAUTIONS: None  WEIGHT BEARING RESTRICTIONS: No  FALLS:  Has patient fallen in last 6 months? No  LIVING ENVIRONMENT: Lives with: lives with their spouse Lives in: House/apartment Stairs: Yes: External: 2 steps; on left going up Has following equipment at home: Single point cane and Walker - 2 wheeled  OCCUPATION: retired  PLOF: Independent  PATIENT GOALS: Improve my mobility, gait, pain level, be able to sleep  NEXT MD VISIT: unclear  OBJECTIVE:   DIAGNOSTIC FINDINGS: MRI  May 2024:  IMPRESSION: 1. At L4-L5 there is moderate bilateral subarticular recess stenosis without substantial central canal stenosis. 2. Mild foraminal stenosis bilaterally at L3-L4 and L4-L5.  PATIENT SURVEYS:  Modified Oswestry 29/50   SCREENING FOR RED FLAGS: Bowel or bladder incontinence: No Spinal tumors: No Cauda equina syndrome: No Compression fracture: No Abdominal aneurysm: No  COGNITION: Overall cognitive status: Within functional limits for tasks assessed     SENSATION: WFL  MUSCLE LENGTH: Hamstrings: Right -55 deg; Left wnl deg Prone knee flexion : Right -10 deg; Left -10 deg, reproduction of pain L$/5 region with overpressure   POSTURE: weight shift L, R knee mildly flexed  PALPATION: Tender , painful, reproduction of pain central  segmental PA 's lower thoracic spine l T 9,to entire  LS spine,spinous processes Pain B glut medius, maximus  LUMBAR ROM:   AROM eval  Flexion 75%  Extension 25%  Right lateral flexion 25%  Left lateral flexion 25%  Right rotation   Left rotation    (Blank rows = not tested)  LOWER EXTREMITY ROM:     All LE ROM wfl LOWER EXTREMITY MMT:    MMT Right eval Left eval  Hip flexion    Hip extension 4- 4-  Hip abduction 4- 4-  Hip adduction    Hip internal rotation    Hip external rotation    Knee flexion    Knee extension 4-   Ankle dorsiflexion HW wnl "  Ankle plantarflexion TW wnl "  Ankle inversion    Ankle eversion     (Blank rows = not tested)  LUMBAR SPECIAL TESTS:  Straight leg raise test: Positive, Slump test: Negative, and prone knee bend (L4) neural stretch with + LBP, familiar sign  FUNCTIONAL TESTS:  30 seconds chair stand test  7  GAIT: Distance walked: over 100' in clinic TODAY'S TREATMENT:                                                                                                                              DATE:  03/01/23  Pt seen for aquatic therapy today.  Treatment took place in water 3.5-4.75 ft in depth at the [facility] pool. Temp of water was 91.  Pt  entered/exited the pool via stairs independently with bilat rail.  * in 4+ft of water, no UE support:  walking forward/ backward, then adding reciprocal arm swing;   * forward walking kicks (hip flex to LAQ)- cues for decrease force of kick * holding wall:  alternating single leg clams ; leg swings into hip flex/ext; hip abdct/ addct x 10 each * side stepping with arm addct, then with yellow hand floats -> side step into wide squat - 2 laps * farmer carry: bilat hand floats under water and side walking forward * holding yellow hand floats:  leg swings into hip flex/ext; hip abdct/ addct x 10 each; heel /toe raises x 10 * TrA set with solid noodle pull down to thighs x 10 with 2  isometrics holds prior to returning to surface * straddling noodle and additional noodle under arms, cycling forward * suspended supine position x 2 min for decompression   02/26/23 * reviewed HEP verbally (and with therapist demo)- minor modifications made   Pt seen for aquatic therapy today.  Treatment took place in water 3.5-4.75 ft in depth at the [facility] pool. Temp of water was 86-91.  Pt entered/exited the pool via stairs independently with bilat rail.  * in 4+ft of water, no UE support:  walking forward/ backward, then adding reciprocal arm swing;   * side stepping with arm addct, then with yellow hand floats -> side step into wide squat  * in therapy pool -> lap pool :* suspended cycling forward (yellow noodle under arms/  yellow noodle between legs) breast stroke arms * TrA set with solid noodle pull down to thighs x 10 * holding wall:  alternating single leg clams ; leg swings into hip flex/ext; hip abdct/ addct x 10 each * forward walking kicks (hip flex to LAQ) * L stretch in shallow water, gentle wag the tail   02/21/23 Pt seen for aquatic therapy today.  Treatment took place in water 3.5-4.75 ft in depth at the [facility] pool. Temp of water was 91.  Pt entered/exited the pool via stairs independently with bilat rail. * intro to aquatic therapy principles * in 4+ft of water, no UE support:  walking forward/ backward, then adding reciprocal arm swing;   * side stepping with arm addct, then with rainbow hand floats  * farmer carry, with single/ bilat rainbow hand floats under water with TrA set, walking forward/ backward * suspended cycling forward (yellow noodle under arms/  yellow noodle between legs)  * TrA set with solid noodle pull down to thighs x 10 * 3 way LE stretch with solid noodle at ankle x 2 reps each LE  Pt requires the buoyancy and hydrostatic pressure of water for support, and to offload joints by unweighting joint load by at least 50 %  in navel deep water and by at least 75-80% in chest to neck deep water.  Viscosity of the water is needed for resistance of strengthening. Water current perturbations provides challenge to standing balance requiring increased core activation.   02/06/23 Therapeutic Exercise: to improve strength and mobility.  Demo, verbal and tactile cues throughout for technique.  Rec Bike L1x20min Supine LTR x 10 bil Supine pelvic tilts x 10  Supine hamstring stretch with strap 2x30 secs bil Supine figure 4 stretch and KTOS piriformis stretch 2x30 sec bil Bridge with GTB 2x10 with 5 seconds hold S/L clamshells GTB x 10 each side Supine march with TrA 5x bil;  01/31/23: Manual:  Prone  for deep cross friction massage R hamstring muscle belly and myofascial release utilizing massage gun and blade to improve tissue perfusion and extensibility. Trigger Point Dry-Needling  Treatment instructions: Expect mild to moderate muscle soreness. S/S of pneumothorax if dry needled over a lung field, and to seek immediate medical attention should they occur. Patient verbalized understanding of these instructions and education. Patient Consent Given: Yes Education handout provided: Previously provided Muscles treated: L L4, L L5 multifidus , L glut medius, R vastus lateralis Electrical stimulation performed: No Parameters: N/A Treatment response/outcome: Twitch Response Elicited and Palpable Increase in Muscle Length  L side lying for muscle energy for flexion rotation side bending restriction, L lumbar region, 2 bouts 6 reps each, 5 sec holds  Therex:   Instructed in self stretch for L lumbar facets with thoracic rotation, provided info from [facility] Instructed in R hamstring eccentric stretch/ strengthen, with firm strap, cues to maintain slight flexion r knee, with supine R hip flex/hamstring stretch and eccentric tightening hamstrings and concentric push with R hamstrings to lower his R leg 01/30/23 :  Prone for DN  Trigger Point Dry-Needling  Treatment instructions: Expect mild to moderate muscle soreness. S/S of pneumothorax if dry needled over a lung field, and to seek immediate medical attention should they occur. Patient verbalized understanding of these instructions and education. Patient Consent Given: Yes Education handout provided: No Muscles treated: L glut med, L glut max 3 pts total Electrical stimulation performed: No Parameters:    Treatment response/outcome: Twitch Response Elicited   PATIENT EDUCATION:  Education details: aquatic therapy intro  Person educated: Patient Education method: Explanation, Demonstration, Actor cues, Verbal cues Education comprehension: verbalized understanding, returned demonstration, and verbal cues required  HOME EXERCISE PROGRAM: Access Code: [email or web address] URL: [email or web address] Date: 02/06/2023 Prepared by: Verta Ellen  Exercises - Supine Figure 4 Piriformis Stretch  - 1 x daily - 7 x weekly - 3 sets - 30 sec hold - Supine Bridge   - 1 x daily - 7 x weekly - 2-3 sets - 10 reps - Clamshell with Resistance  - 1 x daily - 7 x weekly - 2-3 sets - 10 reps 01/30/23: demo'd to patient self pelvic traction by suspending over bed for sacral distraction.   ASSESSMENT:  CLINICAL IMPRESSION: Progressed exercises to less support with challenges to balance while strengthening, with good tolerance.   Pain in back remained centralized at 2/10, Rt knee pain lessened gradually.    Pain to 0/10 in both back and knee when suspended in supine. Will plan to progress to tolerance. Pt to look into area pools in [place] that accept his insurance and report back. Will plan to create HEP and foster independence with those exercises. Pt will continue to benefit from skilled PT to address his deficits and improve his mobility.   OBJECTIVE IMPAIRMENTS: decreased activity tolerance, decreased ROM, decreased strength, hypomobility, increased muscle spasms, impaired  flexibility, and pain.   ACTIVITY LIMITATIONS: carrying, bending, sitting, standing, squatting, sleeping, and bed mobility  PARTICIPATION LIMITATIONS: laundry, shopping, community activity, and yard work  PERSONAL FACTORS: Behavior pattern, Past/current experiences, Time since onset of injury/illness/exacerbation, and 1-2 comorbidities: recent R TKA revision,HTN  are also affecting patient's functional outcome.   REHAB POTENTIAL: Fair    CLINICAL DECISION MAKING: Stable/uncomplicated  EVALUATION COMPLEXITY: Low   GOALS: Goals reviewed with patient? Yes  SHORT TERM GOALS: Target date: 2 weeks through 02/13/23  I HEP Baseline:initiated  Goal status: INITIAL  LONG TERM GOALS: Target date: 04/24/23  Modified oswestry impove to 19/50 Baseline: 29/50 Goal status: INITIAL  2.  Improve R hamstrings flexibility to -30 Baseline: -60 Goal status: INITIAL  3.  Improve 30 sec sit to stand from  7 to12 Baseline: 7 Goal status: INITIAL   PLAN:  PT FREQUENCY: 2x/week  PT DURATION: 12 weeks  PLANNED INTERVENTIONS: Therapeutic exercises, Therapeutic activity, Neuromuscular re-education, Balance training, Gait training, Patient/Family education, Self Care, and Joint mobilization.aquatic PT  PLAN FOR NEXT SESSION: aquatic therapy; reassess for dry needling or other pain mangement modalities, add more LS stretching, therex.    Mayer Camel, PTA [place], [place] 03/01/23 1:58 PM [facility] [address] [place], [place], [address] Phone: [phone]   Fax:  [phone]

## 2023-03-05 ENCOUNTER — Ambulatory Visit (HOSPITAL_BASED_OUTPATIENT_CLINIC_OR_DEPARTMENT_OTHER): Payer: BC Managed Care – PPO | Admitting: Physical Therapy

## 2023-03-05 ENCOUNTER — Encounter (HOSPITAL_BASED_OUTPATIENT_CLINIC_OR_DEPARTMENT_OTHER): Payer: Self-pay | Admitting: Physical Therapy

## 2023-03-05 DIAGNOSIS — M5442 Lumbago with sciatica, left side: Secondary | ICD-10-CM | POA: Diagnosis not present

## 2023-03-05 DIAGNOSIS — G8929 Other chronic pain: Secondary | ICD-10-CM

## 2023-03-05 DIAGNOSIS — R262 Difficulty in walking, not elsewhere classified: Secondary | ICD-10-CM

## 2023-03-05 NOTE — Therapy (Signed)
OUTPATIENT PHYSICAL THERAPY THORACOLUMBAR TREATMENT   Patient Name: Micheal Lucas MRN: [id number] DOB:[birth year], 67 y.o., male Today's Date: 03/05/2023  END OF SESSION:  PT End of Session - 03/05/23 9562     Visit Number 7    Date for PT Re-Evaluation 04/24/23    Progress Note Due on Visit 10    PT Start Time 0815    PT Stop Time 0855    PT Time Calculation (min) 40 min    Activity Tolerance Patient tolerated treatment well    Behavior During Therapy [facility] for tasks assessed/performed               Past Medical History:  Diagnosis Date   Anemia    Arthritis    oa   Bruit of right carotid artery    Cataract    right eye   Elevated cholesterol    GERD (gastroesophageal reflux disease)    Hypertension    Plantar fascial fibromatosis    Past Surgical History:  Procedure Laterality Date   colonscopy  age 64   EYE SURGERY Left 04/10/2016   cataract surgery   EYE SURGERY Bilateral 2015   stents   knee scope and cartlidge implant Left 2009   TOTAL KNEE ARTHROPLASTY Right 05/23/2016   Procedure: RIGHT TOTAL KNEE ARTHROPLASTY;  Surgeon: Durene Romans, MD;  Location: [facility];  Service: Orthopedics;  Laterality: Right;  Adductor block   TOTAL KNEE REVISION Right 10/19/2022   Procedure: TOTAL KNEE REVISION;  Surgeon: Durene Romans, MD;  Location: [facility];  Service: Orthopedics;  Laterality: Right;   Patient Active Problem List   Diagnosis Date Noted   S/P revision of total knee, 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   Obese 05/24/2016   S/P right TKA 05/23/2016   S/P knee replacement 05/23/2016    PCP: Mattie Marlin , MD  REFERRING PROVIDER: Venita Lick  REFERRING DIAG: chronic lower back pain  Rationale for Evaluation and Treatment: Rehabilitation  THERAPY DIAG:  Chronic bilateral low back pain with bilateral sciatica  Difficulty in walking, not elsewhere classified  ONSET DATE: 4 years, since 2020  SUBJECTIVE:                                                                                                                                                                                            SUBJECTIVE STATEMENT: Pt reports he is working with insurance company to make sure his silver sneakers is active. He is still exploring pools in [place] area; hasn't settled on one yet.   PERTINENT HISTORY:  Chronic lower back pain, revision R TKA 3 months ago.  Recovered from TKA, now referred  to PT , MD specifically requested AQUATIC PT  PAIN:  Are you having pain? Yes: NPRS scale: 5/10    (and 6/10 in Rt knee) Pain location: lower back radiating to B lateral thighs to knees Pain description: constant unyielding Aggravating factors: activity, sleeping Relieving factors: none  PRECAUTIONS: None  WEIGHT BEARING RESTRICTIONS: No  FALLS:  Has patient fallen in last 6 months? No  LIVING ENVIRONMENT: Lives with: lives with their spouse Lives in: House/apartment Stairs: Yes: External: 2 steps; on left going up Has following equipment at home: Single point cane and Walker - 2 wheeled  OCCUPATION: retired  PLOF: Independent  PATIENT GOALS: Improve my mobility, gait, pain level, be able to sleep  NEXT MD VISIT: TBD  OBJECTIVE:   DIAGNOSTIC FINDINGS: MRI  May 2024:  IMPRESSION: 1. At L4-L5 there is moderate bilateral subarticular recess stenosis without substantial central canal stenosis. 2. Mild foraminal stenosis bilaterally at L3-L4 and L4-L5.  PATIENT SURVEYS:  Modified Oswestry 29/50   SCREENING FOR RED FLAGS: Bowel or bladder incontinence: No Spinal tumors: No Cauda equina syndrome: No Compression fracture: No Abdominal aneurysm: No  COGNITION: Overall cognitive status: Within functional limits for tasks assessed     SENSATION: WFL  MUSCLE LENGTH: Hamstrings: Right -55 deg; Left wnl deg Prone knee flexion : Right -10 deg; Left -10 deg, reproduction of pain L$/5 region with overpressure   POSTURE: weight shift L, R knee mildly  flexed  PALPATION: Tender , painful, reproduction of pain central segmental PA 's lower thoracic spine l T 9,to entire  LS spine,spinous processes Pain B glut medius, maximus  LUMBAR ROM:   AROM eval  Flexion 75%  Extension 25%  Right lateral flexion 25%  Left lateral flexion 25%  Right rotation   Left rotation    (Blank rows = not tested)  LOWER EXTREMITY ROM:     All LE ROM wfl LOWER EXTREMITY MMT:    MMT Right eval Left eval  Hip flexion    Hip extension 4- 4-  Hip abduction 4- 4-  Hip adduction    Hip internal rotation    Hip external rotation    Knee flexion    Knee extension 4-   Ankle dorsiflexion HW wnl "  Ankle plantarflexion TW wnl "  Ankle inversion    Ankle eversion     (Blank rows = not tested)  LUMBAR SPECIAL TESTS:  Straight leg raise test: Positive, Slump test: Negative, and prone knee bend (L4) neural stretch with + LBP, familiar sign  FUNCTIONAL TESTS:  30 seconds chair stand test  7  GAIT: Distance walked: over 100' in clinic TODAY'S TREATMENT:                                                                                                                              DATE:  03/05/23  Pt seen for aquatic therapy today.  Treatment took place in water 3.5-4.75 ft in depth at  the [facility] pool. Temp of water was 91.  Pt entered/exited the pool via stairs independently with bilat rail.  * in 4+ft of water, no UE support:  walking forward/ backward, then adding reciprocal arm swing;   * -> side step into wide squat with arm addct with yellow hand floats - 2 laps * farmer carry: bilat/ single hand floats under water at side walking forward/backward * 3 way LE kick with UE support on yellow floats, 3 x 5 reps * forward walking kicks (hip flex to LAQ) * back against wall: 3 way LE stretch with ankle on solid noodle R/L/R * straddling noodle and additional noodle under arms, cycling forward 3 laps * seated on 3rd step in water:   piriformis stretch x15s x 2 each LE (LLE tight!), and STS without UE support x 5   03/01/23  Pt seen for aquatic therapy today.  Treatment took place in water 3.5-4.75 ft in depth at the [facility] pool. Temp of water was 91.  Pt entered/exited the pool via stairs independently with bilat rail.  * in 4+ft of water, no UE support:  walking forward/ backward, then adding reciprocal arm swing;   * forward walking kicks (hip flex to LAQ)- cues for decrease force of kick * holding wall:  alternating single leg clams ; leg swings into hip flex/ext; hip abdct/ addct x 10 each * side stepping with arm addct, then with yellow hand floats -> side step into wide squat - 2 laps * farmer carry: bilat hand floats under water and side walking forward * holding yellow hand floats:  leg swings into hip flex/ext; hip abdct/ addct x 10 each; heel /toe raises x 10 * TrA set with solid noodle pull down to thighs x 10 with 2 isometrics holds prior to returning to surface * straddling noodle and additional noodle under arms, cycling forward * suspended supine position x 2 min for decompression   02/26/23 * reviewed HEP verbally (and with therapist demo)- minor modifications made   Pt seen for aquatic therapy today.  Treatment took place in water 3.5-4.75 ft in depth at the [facility] pool. Temp of water was 86-91.  Pt entered/exited the pool via stairs independently with bilat rail.  * in 4+ft of water, no UE support:  walking forward/ backward, then adding reciprocal arm swing;   * side stepping with arm addct, then with yellow hand floats -> side step into wide squat  * in therapy pool -> lap pool :* suspended cycling forward (yellow noodle under arms/  yellow noodle between legs) breast stroke arms * TrA set with solid noodle pull down to thighs x 10 * holding wall:  alternating single leg clams ; leg swings into hip flex/ext; hip abdct/ addct x 10 each * forward walking kicks (hip flex  to LAQ) * L stretch in shallow water, gentle wag the tail   02/21/23 Pt seen for aquatic therapy today.  Treatment took place in water 3.5-4.75 ft in depth at the [facility] pool. Temp of water was 91.  Pt entered/exited the pool via stairs independently with bilat rail. * intro to aquatic therapy principles * in 4+ft of water, no UE support:  walking forward/ backward, then adding reciprocal arm swing;   * side stepping with arm addct, then with rainbow hand floats  * farmer carry, with single/ bilat rainbow hand floats under water with TrA set, walking forward/ backward * suspended cycling forward (yellow noodle under arms/  yellow noodle between legs)  * TrA set with solid noodle pull down to thighs x 10 * 3 way LE stretch with solid noodle at ankle x 2 reps each LE  Pt requires the buoyancy and hydrostatic pressure of water for support, and to offload joints by unweighting joint load by at least 50 % in navel deep water and by at least 75-80% in chest to neck deep water.  Viscosity of the water is needed for resistance of strengthening. Water current perturbations provides challenge to standing balance requiring increased core activation.   02/06/23 Therapeutic Exercise: to improve strength and mobility.  Demo, verbal and tactile cues throughout for technique.  Rec Bike L1x11min Supine LTR x 10 bil Supine pelvic tilts x 10  Supine hamstring stretch with strap 2x30 secs bil Supine figure 4 stretch and KTOS piriformis stretch 2x30 sec bil Bridge with GTB 2x10 with 5 seconds hold S/L clamshells GTB x 10 each side Supine march with TrA 5x bil;  01/31/23: Manual:  Prone for deep cross friction massage R hamstring muscle belly and myofascial release utilizing massage gun and blade to improve tissue perfusion and extensibility. Trigger Point Dry-Needling  Treatment instructions: Expect mild to moderate muscle soreness. S/S of pneumothorax if dry needled over a lung field, and to  seek immediate medical attention should they occur. Patient verbalized understanding of these instructions and education. Patient Consent Given: Yes Education handout provided: Previously provided Muscles treated: L L4, L L5 multifidus , L glut medius, R vastus lateralis Electrical stimulation performed: No Parameters: N/A Treatment response/outcome: Twitch Response Elicited and Palpable Increase in Muscle Length  L side lying for muscle energy for flexion rotation side bending restriction, L lumbar region, 2 bouts 6 reps each, 5 sec holds  Therex:   Instructed in self stretch for L lumbar facets with thoracic rotation, provided info from [facility] Instructed in R hamstring eccentric stretch/ strengthen, with firm strap, cues to maintain slight flexion r knee, with supine R hip flex/hamstring stretch and eccentric tightening hamstrings and concentric push with R hamstrings to lower his R leg 01/30/23 :  Prone for DN Trigger Point Dry-Needling  Treatment instructions: Expect mild to moderate muscle soreness. S/S of pneumothorax if dry needled over a lung field, and to seek immediate medical attention should they occur. Patient verbalized understanding of these instructions and education. Patient Consent Given: Yes Education handout provided: No Muscles treated: L glut med, L glut max 3 pts total Electrical stimulation performed: No Parameters:    Treatment response/outcome: Twitch Response Elicited   PATIENT EDUCATION:  Education details: aquatic therapy intro  Person educated: Patient Education method: Explanation, Demonstration, Actor cues, Verbal cues Education comprehension: verbalized understanding, returned demonstration, and verbal cues required  HOME EXERCISE PROGRAM: Access Code: [email or web address] URL: [email or web address] Date: 02/06/2023 Prepared by: Verta Ellen  Exercises - Supine Figure 4 Piriformis Stretch  - 1 x daily - 7 x weekly - 3 sets - 30 sec hold -  Supine Bridge   - 1 x daily - 7 x weekly - 2-3 sets - 10 reps - Clamshell with Resistance  - 1 x daily - 7 x weekly - 2-3 sets - 10 reps 01/30/23: demo'd to patient self pelvic traction by suspending over bed for sacral distraction.   ASSESSMENT:  CLINICAL IMPRESSION: Pt now reporting more centralization of pain - only in buttocks and not into LEs. Good relief of LE tightness with 3 way LE stretch with solid noodle.  Pain reduced to 0/10 in back  with suspended cycling and remained 0 the remainder of session. Will plan to progress to tolerance. Pt to look into area pools in [place] that accept his insurance and report back. Will plan to create HEP and foster independence with those exercises. Pt will continue to benefit from skilled PT to address his deficits and improve his mobility.   OBJECTIVE IMPAIRMENTS: decreased activity tolerance, decreased ROM, decreased strength, hypomobility, increased muscle spasms, impaired flexibility, and pain.   ACTIVITY LIMITATIONS: carrying, bending, sitting, standing, squatting, sleeping, and bed mobility  PARTICIPATION LIMITATIONS: laundry, shopping, community activity, and yard work  PERSONAL FACTORS: Behavior pattern, Past/current experiences, Time since onset of injury/illness/exacerbation, and 1-2 comorbidities: recent R TKA revision,HTN  are also affecting patient's functional outcome.   REHAB POTENTIAL: Fair    CLINICAL DECISION MAKING: Stable/uncomplicated  EVALUATION COMPLEXITY: Low   GOALS: Goals reviewed with patient? Yes  SHORT TERM GOALS: Target date: 2 weeks through 02/13/23  I HEP Baseline:initiated  Goal status: INITIAL  LONG TERM GOALS: Target date: 04/24/23  Modified oswestry impove to 19/50 Baseline: 29/50 Goal status: INITIAL  2.  Improve R hamstrings flexibility to -30 Baseline: -60 Goal status: INITIAL  3.  Improve 30 sec sit to stand from  7 to12 Baseline: 7 Goal status: INITIAL   PLAN:  PT FREQUENCY: 2x/week  PT  DURATION: 12 weeks  PLANNED INTERVENTIONS: Therapeutic exercises, Therapeutic activity, Neuromuscular re-education, Balance training, Gait training, Patient/Family education, Self Care, and Joint mobilization.aquatic PT  PLAN FOR NEXT SESSION: aquatic therapy; reassess for dry needling or other pain mangement modalities, add more LS stretching, therex.     Mayer Camel, PTA 03/05/23 9:00 AM [facility] [address] [place], [place], [address] Phone: [phone]   Fax:  [phone]

## 2023-03-08 ENCOUNTER — Ambulatory Visit (HOSPITAL_BASED_OUTPATIENT_CLINIC_OR_DEPARTMENT_OTHER): Payer: BC Managed Care – PPO | Admitting: Physical Therapy

## 2023-03-08 ENCOUNTER — Encounter (HOSPITAL_BASED_OUTPATIENT_CLINIC_OR_DEPARTMENT_OTHER): Payer: Self-pay | Admitting: Physical Therapy

## 2023-03-08 DIAGNOSIS — M5442 Lumbago with sciatica, left side: Secondary | ICD-10-CM | POA: Diagnosis not present

## 2023-03-08 DIAGNOSIS — R262 Difficulty in walking, not elsewhere classified: Secondary | ICD-10-CM

## 2023-03-08 DIAGNOSIS — G8929 Other chronic pain: Secondary | ICD-10-CM

## 2023-03-08 NOTE — Therapy (Signed)
OUTPATIENT PHYSICAL THERAPY THORACOLUMBAR TREATMENT   Patient Name: Micheal Lucas MRN: [id number] DOB:1955/12/29, 67 y.o., male Today's Date: 03/08/2023  END OF SESSION:  PT End of Session - 03/08/23 0824     Visit Number 8    Date for PT Re-Evaluation 04/24/23    Progress Note Due on Visit 10    PT Start Time 0812    PT Stop Time 0855    PT Time Calculation (min) 43 min    Behavior During Therapy [facility] for tasks assessed/performed               Past Medical History:  Diagnosis Date   Anemia    Arthritis    oa   Bruit of right carotid artery    Cataract    right eye   Elevated cholesterol    GERD (gastroesophageal reflux disease)    Hypertension    Plantar fascial fibromatosis    Past Surgical History:  Procedure Laterality Date   colonscopy  age 54   EYE SURGERY Left 04/10/2016   cataract surgery   EYE SURGERY Bilateral 2015   stents   knee scope and cartlidge implant Left 2009   TOTAL KNEE ARTHROPLASTY Right 05/23/2016   Procedure: RIGHT TOTAL KNEE ARTHROPLASTY;  Surgeon: Durene Romans, MD;  Location: [facility];  Service: Orthopedics;  Laterality: Right;  Adductor block   TOTAL KNEE REVISION Right 10/19/2022   Procedure: TOTAL KNEE REVISION;  Surgeon: Durene Romans, MD;  Location: [facility];  Service: Orthopedics;  Laterality: Right;   Patient Active Problem List   Diagnosis Date Noted   S/P revision of total knee, 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   Obese 05/24/2016   S/P right TKA 05/23/2016   S/P knee replacement 05/23/2016    PCP: Mattie Marlin , MD  REFERRING PROVIDER: Venita Lick  REFERRING DIAG: chronic lower back pain  Rationale for Evaluation and Treatment: Rehabilitation  THERAPY DIAG:  Chronic bilateral low back pain with bilateral sciatica  Difficulty in walking, not elsewhere classified  ONSET DATE: 4 years, since 2020  SUBJECTIVE:                                                                                                                                                                                            SUBJECTIVE STATEMENT: Pt reports he is working with insurance company to make sure his silver sneakers is active. He is still exploring pools in [place] area; hasn't settled on one yet.   PERTINENT HISTORY:  Chronic lower back pain, revision R TKA 3 months ago.  Recovered from TKA, now referred to PT , MD specifically requested AQUATIC PT  PAIN:  Are you having pain? Yes: NPRS scale: 5/10    (and 6/10 in Rt knee) Pain location: lower back radiating to B lateral thighs to knees Pain description: constant unyielding Aggravating factors: activity, sleeping Relieving factors: none  PRECAUTIONS: None  WEIGHT BEARING RESTRICTIONS: No  FALLS:  Has patient fallen in last 6 months? No  LIVING ENVIRONMENT: Lives with: lives with their spouse Lives in: House/apartment Stairs: Yes: External: 2 steps; on left going up Has following equipment at home: Single point cane and Walker - 2 wheeled  OCCUPATION: retired  PLOF: Independent  PATIENT GOALS: Improve my mobility, gait, pain level, be able to sleep  NEXT MD VISIT: TBD  OBJECTIVE:   DIAGNOSTIC FINDINGS: MRI  May 2024:  IMPRESSION: 1. At L4-L5 there is moderate bilateral subarticular recess stenosis without substantial central canal stenosis. 2. Mild foraminal stenosis bilaterally at L3-L4 and L4-L5.  PATIENT SURVEYS:  Modified Oswestry 29/50   SCREENING FOR RED FLAGS: Bowel or bladder incontinence: No Spinal tumors: No Cauda equina syndrome: No Compression fracture: No Abdominal aneurysm: No  COGNITION: Overall cognitive status: Within functional limits for tasks assessed     SENSATION: WFL  MUSCLE LENGTH: Hamstrings: Right -55 deg; Left wnl deg Prone knee flexion : Right -10 deg; Left -10 deg, reproduction of pain L$/5 region with overpressure   POSTURE: weight shift L, R knee mildly flexed  PALPATION: Tender , painful, reproduction of pain central  segmental PA 's lower thoracic spine l T 9,to entire  LS spine,spinous processes Pain B glut medius, maximus  LUMBAR ROM:   AROM eval  Flexion 75%  Extension 25%  Right lateral flexion 25%  Left lateral flexion 25%  Right rotation   Left rotation    (Blank rows = not tested)  LOWER EXTREMITY ROM:     All LE ROM wfl LOWER EXTREMITY MMT:    MMT Right eval Left eval  Hip flexion    Hip extension 4- 4-  Hip abduction 4- 4-  Hip adduction    Hip internal rotation    Hip external rotation    Knee flexion    Knee extension 4-   Ankle dorsiflexion HW wnl "  Ankle plantarflexion TW wnl "  Ankle inversion    Ankle eversion     (Blank rows = not tested)  LUMBAR SPECIAL TESTS:  Straight leg raise test: Positive, Slump test: Negative, and prone knee bend (L4) neural stretch with + LBP, familiar sign  FUNCTIONAL TESTS:  30 seconds chair stand test  7  GAIT: Distance walked: over 100' in clinic TODAY'S TREATMENT:                                                                                                                              DATE:  03/08/23  Pt seen for aquatic therapy today.  Treatment took place in water 3.5-4.75 ft in depth at the [facility] pool. Temp of water was 91.  Pt entered/exited the pool via stairs independently with bilat rail.  * in 4+ft of water, no UE support:  walking forward/ backward, side stepping, forward walking kick * side step into wide squat with arm addct with yellow ->blue hand float * farmer carry: bilat yellow then single blue hand floats under water at side walking forward *squat with press out with yellow hand float (cues for vertical trunk and heels off floor when in squat position) * 1 Leg superman with UE on yellow hand floats 2 sets of 5 * straddling noodle and additional noodle under arms, cycling forward 3 laps * staggered stance with kick board row 2 x 10 each LE forward (2nd set with slight vectors of 11 and 1   o'clock) * TrA set with kick board push down (straight arms, soft knees) x 5 -> single/bilat rainbow hand float pull downs x 5 * back against wall: 3 way LE stretch with ankle on solid noodle R/L/R (10-15s per position) * seated on 3rd step in water:  piriformis stretch x15s x 2 each LE (LLE tight!),   03/05/23  Pt seen for aquatic therapy today.  Treatment took place in water 3.5-4.75 ft in depth at the [facility] pool. Temp of water was 91.  Pt entered/exited the pool via stairs independently with bilat rail.  * in 4+ft of water, no UE support:  walking forward/ backward, then adding reciprocal arm swing;   * -> side step into wide squat with arm addct with yellow hand floats - 2 laps * farmer carry: bilat/ single hand floats under water at side walking forward/backward * 3 way LE kick with UE support on yellow floats, 3 x 5 reps * forward walking kicks (hip flex to LAQ) * back against wall: 3 way LE stretch with ankle on solid noodle R/L/R * straddling noodle and additional noodle under arms, cycling forward 3 laps * seated on 3rd step in water:  piriformis stretch x15s x 2 each LE (LLE tight!), and STS without UE support x 5   03/01/23  Pt seen for aquatic therapy today.  Treatment took place in water 3.5-4.75 ft in depth at the [facility] pool. Temp of water was 91.  Pt entered/exited the pool via stairs independently with bilat rail.  * in 4+ft of water, no UE support:  walking forward/ backward, then adding reciprocal arm swing;   * forward walking kicks (hip flex to LAQ)- cues for decrease force of kick * holding wall:  alternating single leg clams ; leg swings into hip flex/ext; hip abdct/ addct x 10 each * side stepping with arm addct, then with yellow hand floats -> side step into wide squat - 2 laps * farmer carry: bilat hand floats under water and side walking forward * holding yellow hand floats:  leg swings into hip flex/ext; hip abdct/ addct x 10 each;  heel /toe raises x 10 * TrA set with solid noodle pull down to thighs x 10 with 2 isometrics holds prior to returning to surface * straddling noodle and additional noodle under arms, cycling forward * suspended supine position x 2 min for decompression   02/26/23 * reviewed HEP verbally (and with therapist demo)- minor modifications made   Pt seen for aquatic therapy today.  Treatment took place in water 3.5-4.75 ft in depth at the [facility] pool. Temp of water was 86-91.  Pt entered/exited the pool via stairs independently with bilat rail.  * in 4+ft of water, no  UE support:  walking forward/ backward, then adding reciprocal arm swing;   * side stepping with arm addct, then with yellow hand floats -> side step into wide squat  * in therapy pool -> lap pool :* suspended cycling forward (yellow noodle under arms/  yellow noodle between legs) breast stroke arms * TrA set with solid noodle pull down to thighs x 10 * holding wall:  alternating single leg clams ; leg swings into hip flex/ext; hip abdct/ addct x 10 each * forward walking kicks (hip flex to LAQ) * L stretch in shallow water, gentle wag the tail   02/21/23 Pt seen for aquatic therapy today.  Treatment took place in water 3.5-4.75 ft in depth at the [facility] pool. Temp of water was 91.  Pt entered/exited the pool via stairs independently with bilat rail. * intro to aquatic therapy principles * in 4+ft of water, no UE support:  walking forward/ backward, then adding reciprocal arm swing;   * side stepping with arm addct, then with rainbow hand floats  * farmer carry, with single/ bilat rainbow hand floats under water with TrA set, walking forward/ backward * suspended cycling forward (yellow noodle under arms/  yellow noodle between legs)  * TrA set with solid noodle pull down to thighs x 10 * 3 way LE stretch with solid noodle at ankle x 2 reps each LE  Pt requires the buoyancy and hydrostatic pressure of  water for support, and to offload joints by unweighting joint load by at least 50 % in navel deep water and by at least 75-80% in chest to neck deep water.  Viscosity of the water is needed for resistance of strengthening. Water current perturbations provides challenge to standing balance requiring increased core activation.   02/06/23 Therapeutic Exercise: to improve strength and mobility.  Demo, verbal and tactile cues throughout for technique.  Rec Bike L1x65min Supine LTR x 10 bil Supine pelvic tilts x 10  Supine hamstring stretch with strap 2x30 secs bil Supine figure 4 stretch and KTOS piriformis stretch 2x30 sec bil Bridge with GTB 2x10 with 5 seconds hold S/L clamshells GTB x 10 each side Supine march with TrA 5x bil;  01/31/23: Manual:  Prone for deep cross friction massage R hamstring muscle belly and myofascial release utilizing massage gun and blade to improve tissue perfusion and extensibility. Trigger Point Dry-Needling  Treatment instructions: Expect mild to moderate muscle soreness. S/S of pneumothorax if dry needled over a lung field, and to seek immediate medical attention should they occur. Patient verbalized understanding of these instructions and education. Patient Consent Given: Yes Education handout provided: Previously provided Muscles treated: L L4, L L5 multifidus , L glut medius, R vastus lateralis Electrical stimulation performed: No Parameters: N/A Treatment response/outcome: Twitch Response Elicited and Palpable Increase in Muscle Length  L side lying for muscle energy for flexion rotation side bending restriction, L lumbar region, 2 bouts 6 reps each, 5 sec holds  Therex:   Instructed in self stretch for L lumbar facets with thoracic rotation, provided info from [facility] Instructed in R hamstring eccentric stretch/ strengthen, with firm strap, cues to maintain slight flexion r knee, with supine R hip flex/hamstring stretch and eccentric tightening hamstrings  and concentric push with R hamstrings to lower his R leg 01/30/23 :  Prone for DN Trigger Point Dry-Needling  Treatment instructions: Expect mild to moderate muscle soreness. S/S of pneumothorax if dry needled over a lung field, and to seek immediate medical attention  should they occur. Patient verbalized understanding of these instructions and education. Patient Consent Given: Yes Education handout provided: No Muscles treated: L glut med, L glut max 3 pts total Electrical stimulation performed: No Parameters:    Treatment response/outcome: Twitch Response Elicited   PATIENT EDUCATION:  Education details: aquatic therapy intro  Person educated: Patient Education method: Explanation, Demonstration, Actor cues, Verbal cues Education comprehension: verbalized understanding, returned demonstration, and verbal cues required  HOME EXERCISE PROGRAM: Access Code: [email or web address] URL: [email or web address] Date: 02/06/2023 Prepared by: Verta Ellen  Exercises - Supine Figure 4 Piriformis Stretch  - 1 x daily - 7 x weekly - 3 sets - 30 sec hold - Supine Bridge   - 1 x daily - 7 x weekly - 2-3 sets - 10 reps - Clamshell with Resistance  - 1 x daily - 7 x weekly - 2-3 sets - 10 reps 01/30/23: demo'd to patient self pelvic traction by suspending over bed for sacral distraction.  AQUATIC Access Code: [email or web address] URL: [email or web address] Date: 03/08/2023 Prepared by: [facility] - [address] * not issued yet  ASSESSMENT:  CLINICAL IMPRESSION: Pt tolerated exercises well, without increase in pain.  He has the most reduction of pain with suspended cycling. He reported good reduction of pain and tightness at end of session.    Will issue aquatic HEP next visit. Pt will continue to benefit from skilled PT to address his deficits and improve his mobility.  Land reassessment for progress note next week.   OBJECTIVE IMPAIRMENTS: decreased activity tolerance,  decreased ROM, decreased strength, hypomobility, increased muscle spasms, impaired flexibility, and pain.   ACTIVITY LIMITATIONS: carrying, bending, sitting, standing, squatting, sleeping, and bed mobility  PARTICIPATION LIMITATIONS: laundry, shopping, community activity, and yard work  PERSONAL FACTORS: Behavior pattern, Past/current experiences, Time since onset of injury/illness/exacerbation, and 1-2 comorbidities: recent R TKA revision,HTN  are also affecting patient's functional outcome.   REHAB POTENTIAL: Fair    CLINICAL DECISION MAKING: Stable/uncomplicated  EVALUATION COMPLEXITY: Low   GOALS: Goals reviewed with patient? Yes  SHORT TERM GOALS: Target date: 2 weeks through 02/13/23  I HEP Baseline:initiated  Goal status: INITIAL  LONG TERM GOALS: Target date: 04/24/23  Modified oswestry impove to 19/50 Baseline: 29/50 Goal status: INITIAL  2.  Improve R hamstrings flexibility to -30 Baseline: -60 Goal status: INITIAL  3.  Improve 30 sec sit to stand from  7 to12 Baseline: 7 Goal status: INITIAL   PLAN:  PT FREQUENCY: 2x/week  PT DURATION: 12 weeks  PLANNED INTERVENTIONS: Therapeutic exercises, Therapeutic activity, Neuromuscular re-education, Balance training, Gait training, Patient/Family education, Self Care, and Joint mobilization.aquatic PT  PLAN FOR NEXT SESSION: aquatic therapy; reassess for dry needling or other pain mangement modalities, add more LS stretching, therex.    Mayer Camel, PTA 03/08/23 10:20 AM [facility] [address] [place], [place], [address] Phone: [phone]   Fax:  [phone]

## 2023-03-12 ENCOUNTER — Encounter (HOSPITAL_BASED_OUTPATIENT_CLINIC_OR_DEPARTMENT_OTHER): Payer: Self-pay | Admitting: Physical Therapy

## 2023-03-12 ENCOUNTER — Ambulatory Visit (HOSPITAL_BASED_OUTPATIENT_CLINIC_OR_DEPARTMENT_OTHER): Payer: BC Managed Care – PPO | Admitting: Physical Therapy

## 2023-03-12 DIAGNOSIS — R262 Difficulty in walking, not elsewhere classified: Secondary | ICD-10-CM

## 2023-03-12 DIAGNOSIS — G8929 Other chronic pain: Secondary | ICD-10-CM

## 2023-03-12 DIAGNOSIS — M5442 Lumbago with sciatica, left side: Secondary | ICD-10-CM | POA: Diagnosis not present

## 2023-03-12 NOTE — Therapy (Signed)
OUTPATIENT PHYSICAL THERAPY THORACOLUMBAR TREATMENT   Patient Name: Micheal Lucas MRN: [id number] DOB:Mar 18, 1956, 67 y.o., male Today's Date: 03/12/2023  END OF SESSION:  PT End of Session - 03/12/23 0834     Visit Number 9    Date for PT Re-Evaluation 04/24/23    Progress Note Due on Visit 10    PT Start Time 0812    PT Stop Time 0855    PT Time Calculation (min) 43 min    Activity Tolerance Patient tolerated treatment well    Behavior During Therapy [facility] for tasks assessed/performed               Past Medical History:  Diagnosis Date   Anemia    Arthritis    oa   Bruit of right carotid artery    Cataract    right eye   Elevated cholesterol    GERD (gastroesophageal reflux disease)    Hypertension    Plantar fascial fibromatosis    Past Surgical History:  Procedure Laterality Date   colonscopy  age 58   EYE SURGERY Left 04/10/2016   cataract surgery   EYE SURGERY Bilateral 2015   stents   knee scope and cartlidge implant Left 2009   TOTAL KNEE ARTHROPLASTY Right 05/23/2016   Procedure: RIGHT TOTAL KNEE ARTHROPLASTY;  Surgeon: Durene Romans, MD;  Location: [facility];  Service: Orthopedics;  Laterality: Right;  Adductor block   TOTAL KNEE REVISION Right 10/19/2022   Procedure: TOTAL KNEE REVISION;  Surgeon: Durene Romans, MD;  Location: [facility];  Service: Orthopedics;  Laterality: Right;   Patient Active Problem List   Diagnosis Date Noted   S/P revision of total knee, 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   Obese 05/24/2016   S/P right TKA 05/23/2016   S/P knee replacement 05/23/2016    PCP: Mattie Marlin , MD  REFERRING PROVIDER: Venita Lick  REFERRING DIAG: chronic lower back pain  Rationale for Evaluation and Treatment: Rehabilitation  THERAPY DIAG:  Chronic bilateral low back pain with bilateral sciatica  Difficulty in walking, not elsewhere classified  ONSET DATE: 4 years, since 2020  SUBJECTIVE:                                                                                                                                                                                            SUBJECTIVE STATEMENT: Pt reports his back is "pretty good today".   No word on Silver [employer]neakers yet, so hasn't checked out any pools.   PERTINENT HISTORY:  Chronic lower back pain, revision R TKA 3 months ago.  Recovered from TKA, now referred to PT , MD specifically requested AQUATIC  PT  PAIN:  Are you having pain? Yes: NPRS scale: 5/10    (and 2/10 in Rt knee) Pain location: lower back radiating into buttocks Pain description: dull  Aggravating factors: certain activities Relieving factors: none  PRECAUTIONS: None  WEIGHT BEARING RESTRICTIONS: No  FALLS:  Has patient fallen in last 6 months? No  LIVING ENVIRONMENT: Lives with: lives with their spouse Lives in: House/apartment Stairs: Yes: External: 2 steps; on left going up Has following equipment at home: Single point cane and Walker - 2 wheeled  OCCUPATION: retired  PLOF: Independent  PATIENT GOALS: Improve my mobility, gait, pain level, be able to sleep  NEXT MD VISIT: TBD  OBJECTIVE:   DIAGNOSTIC FINDINGS: MRI  May 2024:  IMPRESSION: 1. At L4-L5 there is moderate bilateral subarticular recess stenosis without substantial central canal stenosis. 2. Mild foraminal stenosis bilaterally at L3-L4 and L4-L5.  PATIENT SURVEYS:  Modified Oswestry 29/50   SCREENING FOR RED FLAGS: Bowel or bladder incontinence: No Spinal tumors: No Cauda equina syndrome: No Compression fracture: No Abdominal aneurysm: No  COGNITION: Overall cognitive status: Within functional limits for tasks assessed     SENSATION: WFL  MUSCLE LENGTH: Hamstrings: Right -55 deg; Left wnl deg Prone knee flexion : Right -10 deg; Left -10 deg, reproduction of pain L$/5 region with overpressure   POSTURE: weight shift L, R knee mildly flexed  PALPATION: Tender , painful, reproduction of pain central segmental PA 's lower  thoracic spine l T 9,to entire  LS spine,spinous processes Pain B glut medius, maximus  LUMBAR ROM:   AROM eval  Flexion 75%  Extension 25%  Right lateral flexion 25%  Left lateral flexion 25%  Right rotation   Left rotation    (Blank rows = not tested)  LOWER EXTREMITY ROM:     All LE ROM wfl LOWER EXTREMITY MMT:    MMT Right eval Left eval  Hip flexion    Hip extension 4- 4-  Hip abduction 4- 4-  Hip adduction    Hip internal rotation    Hip external rotation    Knee flexion    Knee extension 4-   Ankle dorsiflexion HW wnl "  Ankle plantarflexion TW wnl "  Ankle inversion    Ankle eversion     (Blank rows = not tested)  LUMBAR SPECIAL TESTS:  Straight leg raise test: Positive, Slump test: Negative, and prone knee bend (L4) neural stretch with + LBP, familiar sign  FUNCTIONAL TESTS:  30 seconds chair stand test  7  GAIT: Distance walked: over 100' in clinic TODAY'S TREATMENT:                                                                                                                              DATE:  03/12/23  Pt seen for aquatic therapy today.  Treatment took place in water 3.5-4.75 ft in depth at the [facility] pool. Temp of water was 91.  Pt  entered/exited the pool via stairs independently with bilat rail.  * in 4+ft of water, no UE support:  walking forward/ backward,  * back against wall: 3 way LE stretch with ankle on hollow blue noodle R/L/R (10-15s per position) * quad stretch with ankle supported by hollow blue noodle x 3 reps of 15 sec * side step into wide squat with arm addct with yellow hand float * 1 Leg superman with UE on yellow hand floats 2 sets of 5 * farmer carry: bilat yellow hand floats under water at side walking forward, then reciprocal arm swing * SLS with 3 way LE kick x 10 each LE, UE support on yellow hand floats * TrA set with bilat rainbow hand float pull downs x 10 * staggered stance with kick board row 2 x 10  each LE forward (2nd set with slight vectors of 11 and 1  o'clock) * seated on 3rd step in water:  piriformis stretch x15s x 2 each LE * plank on steps with hip ext; plank on blue hand floats x 30 sec * straddling noodle and additional noodle under arms, cycling forward 3 laps   03/08/23  Pt seen for aquatic therapy today.  Treatment took place in water 3.5-4.75 ft in depth at the [facility] pool. Temp of water was 91.  Pt entered/exited the pool via stairs independently with bilat rail.  * in 4+ft of water, no UE support:  walking forward/ backward, side stepping, forward walking kick * side step into wide squat with arm addct with yellow ->blue hand float * farmer carry: bilat yellow then single blue hand floats under water at side walking forward *squat with press out with yellow hand float (cues for vertical trunk and heels off floor when in squat position) * 1 Leg superman with UE on yellow hand floats 2 sets of 5 * straddling noodle and additional noodle under arms, cycling forward 3 laps * staggered stance with kick board row 2 x 10 each LE forward (2nd set with slight vectors of 11 and 1  o'clock) * TrA set with kick board push down (straight arms, soft knees) x 5 -> single/bilat rainbow hand float pull downs x 5 * back against wall: 3 way LE stretch with ankle on solid noodle R/L/R (10-15s per position) * seated on 3rd step in water:  piriformis stretch x15s x 2 each LE (LLE tight!),   03/05/23  Pt seen for aquatic therapy today.  Treatment took place in water 3.5-4.75 ft in depth at the [facility] pool. Temp of water was 91.  Pt entered/exited the pool via stairs independently with bilat rail.  * in 4+ft of water, no UE support:  walking forward/ backward, then adding reciprocal arm swing;   * -> side step into wide squat with arm addct with yellow hand floats - 2 laps * farmer carry: bilat/ single hand floats under water at side walking forward/backward * 3  way LE kick with UE support on yellow floats, 3 x 5 reps * forward walking kicks (hip flex to LAQ) * back against wall: 3 way LE stretch with ankle on solid noodle R/L/R * straddling noodle and additional noodle under arms, cycling forward 3 laps * seated on 3rd step in water:  piriformis stretch x15s x 2 each LE (LLE tight!), and STS without UE support x 5   03/01/23  Pt seen for aquatic therapy today.  Treatment took place in water 3.5-4.75 ft in depth at the  [facility] pool. Temp of water was 91.  Pt entered/exited the pool via stairs independently with bilat rail.  * in 4+ft of water, no UE support:  walking forward/ backward, then adding reciprocal arm swing;   * forward walking kicks (hip flex to LAQ)- cues for decrease force of kick * holding wall:  alternating single leg clams ; leg swings into hip flex/ext; hip abdct/ addct x 10 each * side stepping with arm addct, then with yellow hand floats -> side step into wide squat - 2 laps * farmer carry: bilat hand floats under water and side walking forward * holding yellow hand floats:  leg swings into hip flex/ext; hip abdct/ addct x 10 each; heel /toe raises x 10 * TrA set with solid noodle pull down to thighs x 10 with 2 isometrics holds prior to returning to surface * straddling noodle and additional noodle under arms, cycling forward * suspended supine position x 2 min for decompression   02/26/23 * reviewed HEP verbally (and with therapist demo)- minor modifications made   Pt seen for aquatic therapy today.  Treatment took place in water 3.5-4.75 ft in depth at the [facility] pool. Temp of water was 86-91.  Pt entered/exited the pool via stairs independently with bilat rail.  * in 4+ft of water, no UE support:  walking forward/ backward, then adding reciprocal arm swing;   * side stepping with arm addct, then with yellow hand floats -> side step into wide squat  * in therapy pool -> lap pool :* suspended  cycling forward (yellow noodle under arms/  yellow noodle between legs) breast stroke arms * TrA set with solid noodle pull down to thighs x 10 * holding wall:  alternating single leg clams ; leg swings into hip flex/ext; hip abdct/ addct x 10 each * forward walking kicks (hip flex to LAQ) * L stretch in shallow water, gentle wag the tail   02/21/23 Pt seen for aquatic therapy today.  Treatment took place in water 3.5-4.75 ft in depth at the [facility] pool. Temp of water was 91.  Pt entered/exited the pool via stairs independently with bilat rail. * intro to aquatic therapy principles * in 4+ft of water, no UE support:  walking forward/ backward, then adding reciprocal arm swing;   * side stepping with arm addct, then with rainbow hand floats  * farmer carry, with single/ bilat rainbow hand floats under water with TrA set, walking forward/ backward * suspended cycling forward (yellow noodle under arms/  yellow noodle between legs)  * TrA set with solid noodle pull down to thighs x 10 * 3 way LE stretch with solid noodle at ankle x 2 reps each LE  Pt requires the buoyancy and hydrostatic pressure of water for support, and to offload joints by unweighting joint load by at least 50 % in navel deep water and by at least 75-80% in chest to neck deep water.  Viscosity of the water is needed for resistance of strengthening. Water current perturbations provides challenge to standing balance requiring increased core activation.   02/06/23 Therapeutic Exercise: to improve strength and mobility.  Demo, verbal and tactile cues throughout for technique.  Rec Bike L1x45min Supine LTR x 10 bil Supine pelvic tilts x 10  Supine hamstring stretch with strap 2x30 secs bil Supine figure 4 stretch and KTOS piriformis stretch 2x30 sec bil Bridge with GTB 2x10 with 5 seconds hold S/L clamshells GTB x 10 each side Supine march  with TrA 5x bil;  01/31/23: Manual:  Prone for deep cross friction massage  R hamstring muscle belly and myofascial release utilizing massage gun and blade to improve tissue perfusion and extensibility. Trigger Point Dry-Needling  Treatment instructions: Expect mild to moderate muscle soreness. S/S of pneumothorax if dry needled over a lung field, and to seek immediate medical attention should they occur. Patient verbalized understanding of these instructions and education. Patient Consent Given: Yes Education handout provided: Previously provided Muscles treated: L L4, L L5 multifidus , L glut medius, R vastus lateralis Electrical stimulation performed: No Parameters: N/A Treatment response/outcome: Twitch Response Elicited and Palpable Increase in Muscle Length  L side lying for muscle energy for flexion rotation side bending restriction, L lumbar region, 2 bouts 6 reps each, 5 sec holds  Therex:   Instructed in self stretch for L lumbar facets with thoracic rotation, provided info from [facility] Instructed in R hamstring eccentric stretch/ strengthen, with firm strap, cues to maintain slight flexion r knee, with supine R hip flex/hamstring stretch and eccentric tightening hamstrings and concentric push with R hamstrings to lower his R leg 01/30/23 :  Prone for DN Trigger Point Dry-Needling  Treatment instructions: Expect mild to moderate muscle soreness. S/S of pneumothorax if dry needled over a lung field, and to seek immediate medical attention should they occur. Patient verbalized understanding of these instructions and education. Patient Consent Given: Yes Education handout provided: No Muscles treated: L glut med, L glut max 3 pts total Electrical stimulation performed: No Parameters:    Treatment response/outcome: Twitch Response Elicited   PATIENT EDUCATION:  Education details: aquatic therapy intro  Person educated: Patient Education method: Explanation, Demonstration, Actor cues, Verbal cues Education comprehension: verbalized understanding,  returned demonstration, and verbal cues required  HOME EXERCISE PROGRAM: Access Code: [email or web address] URL: [email or web address] Date: 02/06/2023 Prepared by: Verta Ellen  Exercises - Supine Figure 4 Piriformis Stretch  - 1 x daily - 7 x weekly - 3 sets - 30 sec hold - Supine Bridge   - 1 x daily - 7 x weekly - 2-3 sets - 10 reps - Clamshell with Resistance  - 1 x daily - 7 x weekly - 2-3 sets - 10 reps 01/30/23: demo'd to patient self pelvic traction by suspending over bed for sacral distraction.  AQUATIC Access Code: [email or web address] URL: [email or web address] Date: 03/08/2023 Prepared by: [facility] This aquatic home exercise program from [employer] utilizes pictures from land based exercises, but has been adapted prior to lamination and issuance.    ASSESSMENT:  CLINICAL IMPRESSION: Pt tolerated exercises well, without increase in pain. Issued aquatic HEP and reviewed exercises on it.  Land reassessment for progress note next visit. Will continue working with pt to become independent with aquatic exercises as he transitions to community pool.   Pt will continue to benefit from skilled PT to address his deficits and improve his mobility.    OBJECTIVE IMPAIRMENTS: decreased activity tolerance, decreased ROM, decreased strength, hypomobility, increased muscle spasms, impaired flexibility, and pain.   ACTIVITY LIMITATIONS: carrying, bending, sitting, standing, squatting, sleeping, and bed mobility  PARTICIPATION LIMITATIONS: laundry, shopping, community activity, and yard work  PERSONAL FACTORS: Behavior pattern, Past/current experiences, Time since onset of injury/illness/exacerbation, and 1-2 comorbidities: recent R TKA revision,HTN  are also affecting patient's functional outcome.   REHAB POTENTIAL: Fair    CLINICAL DECISION MAKING: Stable/uncomplicated  EVALUATION COMPLEXITY: Low   GOALS: Goals reviewed with patient?  Yes  SHORT TERM GOALS: Target date:  2 weeks through 02/13/23  I HEP Baseline:initiated  Goal status: INITIAL  LONG TERM GOALS: Target date: 04/24/23  Modified oswestry impove to 19/50 Baseline: 29/50 Goal status: INITIAL  2.  Improve R hamstrings flexibility to -30 Baseline: -60 Goal status: INITIAL  3.  Improve 30 sec sit to stand from  7 to12 Baseline: 7 Goal status: INITIAL   PLAN:  PT FREQUENCY: 2x/week  PT DURATION: 12 weeks  PLANNED INTERVENTIONS: Therapeutic exercises, Therapeutic activity, Neuromuscular re-education, Balance training, Gait training, Patient/Family education, Self Care, and Joint mobilization.aquatic PT  PLAN FOR NEXT SESSION: aquatic therapy; reassess for dry needling or other pain mangement modalities, add more LS stretching, therex.    Mayer Camel, PTA 03/12/23 1:31 PM [facility] [address] [place], [place], [address] Phone: [phone]   Fax:  [phone]

## 2023-03-13 ENCOUNTER — Other Ambulatory Visit: Payer: Self-pay

## 2023-03-13 ENCOUNTER — Ambulatory Visit: Payer: BC Managed Care – PPO | Attending: Orthopedic Surgery

## 2023-03-13 DIAGNOSIS — M5441 Lumbago with sciatica, right side: Secondary | ICD-10-CM | POA: Diagnosis present

## 2023-03-13 DIAGNOSIS — G8929 Other chronic pain: Secondary | ICD-10-CM | POA: Diagnosis present

## 2023-03-13 DIAGNOSIS — M5442 Lumbago with sciatica, left side: Secondary | ICD-10-CM | POA: Insufficient documentation

## 2023-03-13 DIAGNOSIS — R262 Difficulty in walking, not elsewhere classified: Secondary | ICD-10-CM | POA: Insufficient documentation

## 2023-03-13 NOTE — Therapy (Signed)
OUTPATIENT PHYSICAL THERAPY THORACOLUMBAR TREATMENT Progress Note Reporting Period 01/30/23 to 03/13/23  See note below for Objective Data and Assessment of Progress/Goals.       Patient Name: Micheal Lucas MRN: [id number] DOB:02/28/1956, 67 y.o., male Today's Date: 03/13/2023  END OF SESSION:  PT End of Session - 03/13/23 1145     Progress Note Due on Visit 20    PT Start Time 1054    PT Stop Time 1143    PT Time Calculation (min) 49 min    Activity Tolerance Patient tolerated treatment well    Behavior During Therapy WFL for tasks assessed/performed                Past Medical History:  Diagnosis Date   Anemia    Arthritis    oa   Bruit of right carotid artery    Cataract    right eye   Elevated cholesterol    GERD (gastroesophageal reflux disease)    Hypertension    Plantar fascial fibromatosis    Past Surgical History:  Procedure Laterality Date   colonscopy  age 80   EYE SURGERY Left 04/10/2016   cataract surgery   EYE SURGERY Bilateral 2015   stents   knee scope and cartlidge implant Left 2009   TOTAL KNEE ARTHROPLASTY Right 05/23/2016   Procedure: RIGHT TOTAL KNEE ARTHROPLASTY;  Surgeon: Durene Romans, MD;  Location: [facility];  Service: Orthopedics;  Laterality: Right;  Adductor block   TOTAL KNEE REVISION Right 10/19/2022   Procedure: TOTAL KNEE REVISION;  Surgeon: Durene Romans, MD;  Location: [facility];  Service: Orthopedics;  Laterality: Right;   Patient Active Problem List   Diagnosis Date Noted   S/P revision of total knee, 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   Obese 05/24/2016   S/P right TKA 05/23/2016   S/P knee replacement 05/23/2016    PCP: Mattie Marlin , MD  REFERRING PROVIDER: Venita Lick  REFERRING DIAG: chronic lower back pain  Rationale for Evaluation and Treatment: Rehabilitation  THERAPY DIAG:  Chronic bilateral low back pain with bilateral sciatica  Difficulty in walking, not elsewhere classified  ONSET DATE: 4 years, since  2020  SUBJECTIVE:                                                                                                                                                                                           SUBJECTIVE STATEMENT: Pt reports much improved with pain since initiating PT and particularly aquatic therapy.  States feels good, more mobile for about 24 hrs after aquatic session then stiff again prior to his next appt.  Has contacted silver sneakers , wants  to transition to local pool to continue Rx but hasn't heard back from them yet.  Biggest complaint with lower back is difficulty sleeping,and prolonged sitting,  pain more localized than at initial evaluation to central lower back now and not radiating into buttocks and post thighs like it was   PERTINENT HISTORY:  Chronic lower back pain, revision R TKA 3 months ago.  Recovered from TKA, now referred to PT , MD specifically requested AQUATIC PT  PAIN:  Are you having pain? Yes: NPRS scale: 5/10    (and 2/10 in Rt knee) Pain location: lower back radiating into buttocks Pain description: dull  Aggravating factors: certain activities Relieving factors: none  PRECAUTIONS: None  WEIGHT BEARING RESTRICTIONS: No  FALLS:  Has patient fallen in last 6 months? No  LIVING ENVIRONMENT: Lives with: lives with their spouse Lives in: House/apartment Stairs: Yes: External: 2 steps; on left going up Has following equipment at home: Single point cane and Walker - 2 wheeled  OCCUPATION: retired  PLOF: Independent  PATIENT GOALS: Improve my mobility, gait, pain level, be able to sleep  NEXT MD VISIT: TBD  OBJECTIVE:   DIAGNOSTIC FINDINGS: MRI  May 2024:  IMPRESSION: 1. At L4-L5 there is moderate bilateral subarticular recess stenosis without substantial central canal stenosis. 2. Mild foraminal stenosis bilaterally at L3-L4 and L4-L5.  PATIENT SURVEYS:  Modified Oswestry 29/50   SCREENING FOR RED FLAGS: Bowel or bladder  incontinence: No Spinal tumors: No Cauda equina syndrome: No Compression fracture: No Abdominal aneurysm: No  COGNITION: Overall cognitive status: Within functional limits for tasks assessed     SENSATION: WFL  MUSCLE LENGTH: Hamstrings: Right -55 deg; Left wnl deg Prone knee flexion : Right -10 deg; Left -10 deg, reproduction of pain L$/5 region with overpressure   POSTURE: weight shift L, R knee mildly flexed  PALPATION: Tender , painful, reproduction of pain central segmental PA 's lower thoracic spine l T 9,to entire  LS spine,spinous processes Pain B glut medius, maximus  LUMBAR ROM:   AROM eval  Flexion 75%  Extension 25%  Right lateral flexion 25%  Left lateral flexion 25%  Right rotation   Left rotation    (Blank rows = not tested)  LOWER EXTREMITY ROM:     All LE ROM wfl LOWER EXTREMITY MMT:   03/13/23:  MMT for B quads wnl  MMT Right eval Left eval  Hip flexion    Hip extension 4- 4-  Hip abduction 4- 4-  Hip adduction    Hip internal rotation    Hip external rotation    Knee flexion    Knee extension 4-   Ankle dorsiflexion HW wnl "  Ankle plantarflexion TW wnl "  Ankle inversion    Ankle eversion     (Blank rows = not tested)  LUMBAR SPECIAL TESTS:  Straight leg raise test: Positive, Slump test: Negative, and prone knee bend (L4) neural stretch with + LBP, familiar sign  FUNCTIONAL TESTS:  30 seconds chair stand test  7  GAIT: Distance walked: over 100' in clinic TODAY'S TREATMENT:  DATE:  03/13/23:  Reassessed patient today and progress toward goals.   Therex:  Instructed in unilateral hip flexor stretch off bed with  UE press up , up to 1 min each side Instructed in supine thoracic roll stretch with roll ll to spine , combined with pec flys and B shoulder flexion, with light(4# st to adjunct  stretch)  Manual: Assessed segmental glides lumbar and thoracic spine, lumbar tender, inflamed, thoracic with less mobility noted. PA jt mobs B post lower ribs and thoracic spine Trigger Point Dry-Needling  Treatment instructions: Expect mild to moderate muscle soreness. S/S of pneumothorax if dry needled over a lung field, and to seek immediate medical attention should they occur. Patient verbalized understanding of these instructions and education. Patient Consent Given: Yes Education handout provided: Previously provided Muscles treated: B L4 and L5 multifidi Treatment response/outcome: Twitch Response Elicited and Palpable Increase in Muscle Length  03/12/23  Pt seen for aquatic therapy today.  Treatment took place in water 3.5-4.75 ft in depth at the [facility] pool. Temp of water was 91.  Pt entered/exited the pool via stairs independently with bilat rail.  * in 4+ft of water, no UE support:  walking forward/ backward,  * back against wall: 3 way LE stretch with ankle on hollow blue noodle R/L/R (10-15s per position) * quad stretch with ankle supported by hollow blue noodle x 3 reps of 15 sec * side step into wide squat with arm addct with yellow hand float * 1 Leg superman with UE on yellow hand floats 2 sets of 5 * farmer carry: bilat yellow hand floats under water at side walking forward, then reciprocal arm swing * SLS with 3 way LE kick x 10 each LE, UE support on yellow hand floats * TrA set with bilat rainbow hand float pull downs x 10 * staggered stance with kick board row 2 x 10 each LE forward (2nd set with slight vectors of 11 and 1  o'clock) * seated on 3rd step in water:  piriformis stretch x15s x 2 each LE * plank on steps with hip ext; plank on blue hand floats x 30 sec * straddling noodle and additional noodle under arms, cycling forward 3 laps   03/08/23  Pt seen for aquatic therapy today.  Treatment took place in water 3.5-4.75 ft in depth at the  [facility] pool. Temp of water was 91.  Pt entered/exited the pool via stairs independently with bilat rail.  * in 4+ft of water, no UE support:  walking forward/ backward, side stepping, forward walking kick * side step into wide squat with arm addct with yellow ->blue hand float * farmer carry: bilat yellow then single blue hand floats under water at side walking forward *squat with press out with yellow hand float (cues for vertical trunk and heels off floor when in squat position) * 1 Leg superman with UE on yellow hand floats 2 sets of 5 * straddling noodle and additional noodle under arms, cycling forward 3 laps * staggered stance with kick board row 2 x 10 each LE forward (2nd set with slight vectors of 11 and 1  o'clock) * TrA set with kick board push down (straight arms, soft knees) x 5 -> single/bilat rainbow hand float pull downs x 5 * back against wall: 3 way LE stretch with ankle on solid noodle R/L/R (10-15s per position) * seated on 3rd step in water:  piriformis stretch x15s x 2 each LE (LLE tight!),  03/05/23  Pt seen for aquatic therapy today.  Treatment took place in water 3.5-4.75 ft in depth at the [facility] pool. Temp of water was 91.  Pt entered/exited the pool via stairs independently with bilat rail.  * in 4+ft of water, no UE support:  walking forward/ backward, then adding reciprocal arm swing;   * -> side step into wide squat with arm addct with yellow hand floats - 2 laps * farmer carry: bilat/ single hand floats under water at side walking forward/backward * 3 way LE kick with UE support on yellow floats, 3 x 5 reps * forward walking kicks (hip flex to LAQ) * back against wall: 3 way LE stretch with ankle on solid noodle R/L/R * straddling noodle and additional noodle under arms, cycling forward 3 laps * seated on 3rd step in water:  piriformis stretch x15s x 2 each LE (LLE tight!), and STS without UE support x 5   03/01/23  Pt seen  for aquatic therapy today.  Treatment took place in water 3.5-4.75 ft in depth at the [facility] pool. Temp of water was 91.  Pt entered/exited the pool via stairs independently with bilat rail.  * in 4+ft of water, no UE support:  walking forward/ backward, then adding reciprocal arm swing;   * forward walking kicks (hip flex to LAQ)- cues for decrease force of kick * holding wall:  alternating single leg clams ; leg swings into hip flex/ext; hip abdct/ addct x 10 each * side stepping with arm addct, then with yellow hand floats -> side step into wide squat - 2 laps * farmer carry: bilat hand floats under water and side walking forward * holding yellow hand floats:  leg swings into hip flex/ext; hip abdct/ addct x 10 each; heel /toe raises x 10 * TrA set with solid noodle pull down to thighs x 10 with 2 isometrics holds prior to returning to surface * straddling noodle and additional noodle under arms, cycling forward * suspended supine position x 2 min for decompression   02/26/23 * reviewed HEP verbally (and with therapist demo)- minor modifications made   Pt seen for aquatic therapy today.  Treatment took place in water 3.5-4.75 ft in depth at the [facility] pool. Temp of water was 86-91.  Pt entered/exited the pool via stairs independently with bilat rail.  * in 4+ft of water, no UE support:  walking forward/ backward, then adding reciprocal arm swing;   * side stepping with arm addct, then with yellow hand floats -> side step into wide squat  * in therapy pool -> lap pool :* suspended cycling forward (yellow noodle under arms/  yellow noodle between legs) breast stroke arms * TrA set with solid noodle pull down to thighs x 10 * holding wall:  alternating single leg clams ; leg swings into hip flex/ext; hip abdct/ addct x 10 each * forward walking kicks (hip flex to LAQ) * L stretch in shallow water, gentle wag the tail   02/21/23 Pt seen for aquatic therapy  today.  Treatment took place in water 3.5-4.75 ft in depth at the [facility] pool. Temp of water was 91.  Pt entered/exited the pool via stairs independently with bilat rail. * intro to aquatic therapy principles * in 4+ft of water, no UE support:  walking forward/ backward, then adding reciprocal arm swing;   * side stepping with arm addct, then with rainbow hand floats  * farmer carry, with single/ bilat  rainbow hand floats under water with TrA set, walking forward/ backward * suspended cycling forward (yellow noodle under arms/  yellow noodle between legs)  * TrA set with solid noodle pull down to thighs x 10 * 3 way LE stretch with solid noodle at ankle x 2 reps each LE  Pt requires the buoyancy and hydrostatic pressure of water for support, and to offload joints by unweighting joint load by at least 50 % in navel deep water and by at least 75-80% in chest to neck deep water.  Viscosity of the water is needed for resistance of strengthening. Water current perturbations provides challenge to standing balance requiring increased core activation.   02/06/23 Therapeutic Exercise: to improve strength and mobility.  Demo, verbal and tactile cues throughout for technique.  Rec Bike L1x3min Supine LTR x 10 bil Supine pelvic tilts x 10  Supine hamstring stretch with strap 2x30 secs bil Supine figure 4 stretch and KTOS piriformis stretch 2x30 sec bil Bridge with GTB 2x10 with 5 seconds hold S/L clamshells GTB x 10 each side Supine march with TrA 5x bil;  01/31/23: Manual:  Prone for deep cross friction massage R hamstring muscle belly and myofascial release utilizing massage gun and blade to improve tissue perfusion and extensibility. Trigger Point Dry-Needling ; Treatment instructions: Expect mild to moderate muscle soreness. S/S of pneumothorax if dry needled over a lung field, and to seek immediate medical attention should they occur. Patient verbalized understanding of these  instructions and education. Patient Consent Given: Yes Education handout provided: Previously provided Muscles treated: L L4, L L5 multifidus , L glut medius, R vastus lateralis Electrical stimulation performed: No Parameters: N/A Treatment response/outcome: Twitch Response Elicited and Palpable Increase in Muscle Length  L side lying for muscle energy for flexion rotation side bending restriction, L lumbar region, 2 bouts 6 reps each, 5 sec holds  Therex:   Instructed in self stretch for L lumbar facets with thoracic rotation, provided info from [employer] Instructed in R hamstring eccentric stretch/ strengthen, with firm strap, cues to maintain slight flexion r knee, with supine R hip flex/hamstring stretch and eccentric tightening hamstrings and concentric push with R hamstrings to lower his R leg 01/30/23 :  Prone for DN Trigger Point Dry-Needling  Treatment instructions: Expect mild to moderate muscle soreness. S/S of pneumothorax if dry needled over a lung field, and to seek immediate medical attention should they occur. Patient verbalized understanding of these instructions and education. Patient Consent Given: Yes Education handout provided: No Muscles treated: L glut med, L glut max 3 pts total Electrical stimulation performed: No Parameters:    Treatment response/outcome: Twitch Response Elicited   PATIENT EDUCATION:  Education details: aquatic therapy intro  Person educated: Patient Education method: Explanation, Demonstration, Actor cues, Verbal cues Education comprehension: verbalized understanding, returned demonstration, and verbal cues required  HOME EXERCISE PROGRAM: Access Code: [email or web address] URL: [email or web address] Date: 02/06/2023 Prepared by: Verta Ellen  Exercises - Supine Figure 4 Piriformis Stretch  - 1 x daily - 7 x weekly - 3 sets - 30 sec hold - Supine Bridge   - 1 x daily - 7 x weekly - 2-3 sets - 10 reps - Clamshell with Resistance  - 1  x daily - 7 x weekly - 2-3 sets - 10 reps 01/30/23: demo'd to patient self pelvic traction by suspending over bed for sacral distraction.  AQUATIC Access Code: [email or web address] URL: [email or web address] Date: 03/08/2023 Prepared by: [facility] [address] This aquatic home  exercise program from [employer] utilizes pictures from land based exercises, but has been adapted prior to lamination and issuance.    ASSESSMENT:  CLINICAL IMPRESSION: Pt reassessed today.  He has improved in all categories/ areas.  Really likes the aquatic PT.  States he is able to move much better than at initial evaluation.  Noted still quite tender//inflamed lower lumbar region, and decreased flexibility B hip flexors, with + LBP with prone knee flexion test, at 85 degrees B. Therefore determined to add stretching specific to ant hips as well as thoracic spine stretch which pt may perform when not at aquatic PT.  Will continue aquatic PT and patient to transition in the next few weeks to a pool  locally with silver sneakers. Increasing frequency of aquatic PT for a couple of weeks to 3 x week until he can make the transition.  He is to return again for PT assessment on visit 20.  Pt will continue to benefit from skilled PT to address his deficits and improve his mobility.    OBJECTIVE IMPAIRMENTS: decreased activity tolerance, decreased ROM, decreased strength, hypomobility, increased muscle spasms, impaired flexibility, and pain.   ACTIVITY LIMITATIONS: carrying, bending, sitting, standing, squatting, sleeping, and bed mobility  PARTICIPATION LIMITATIONS: laundry, shopping, community activity, and yard work  PERSONAL FACTORS: Behavior pattern, Past/current experiences, Time since onset of injury/illness/exacerbation, and 1-2 comorbidities: recent R TKA revision,HTN  are also affecting patient's functional outcome.   REHAB POTENTIAL: Fair    CLINICAL DECISION MAKING:  Stable/uncomplicated  EVALUATION COMPLEXITY: Low   GOALS: Goals reviewed with patient? Yes  SHORT TERM GOALS: Target date: 2 weeks through 02/13/23  I HEP Baseline:initiated  Goal status: INITIAL  LONG TERM GOALS: Target date: 04/24/23  Modified oswestry impove to 19/50 Baseline: 29/50 Goal status: IN PROGRESS 03/13/23 : 19/50  2.  Improve R hamstrings flexibility to -30 Baseline: -60 Goal status: MET -15 today  3.  Improve 30 sec sit to stand from  7 to12 Baseline: 7 Goal status: IN PROGRESS 03/13/23: 11   PLAN:  PT FREQUENCY: 3x/week  PT DURATION: 12 weeks  PLANNED INTERVENTIONS: Therapeutic exercises, Therapeutic activity, Neuromuscular re-education, Balance training, Gait training, Patient/Family education, Self Care, and Joint mobilization.aquatic PT  PLAN FOR NEXT SESSION: aquatic therapy; reassess for dry needling or other pain mangement modalities, add more LS stretching, therex.    Early Chars, PT, DPT Board-certified Specialist in Orthopaedic Physical Therapy  03/13/23

## 2023-03-15 ENCOUNTER — Ambulatory Visit (HOSPITAL_BASED_OUTPATIENT_CLINIC_OR_DEPARTMENT_OTHER): Payer: Medicare Other | Attending: Orthopedic Surgery | Admitting: Physical Therapy

## 2023-03-15 ENCOUNTER — Encounter (HOSPITAL_BASED_OUTPATIENT_CLINIC_OR_DEPARTMENT_OTHER): Payer: Self-pay | Admitting: Physical Therapy

## 2023-03-15 DIAGNOSIS — R262 Difficulty in walking, not elsewhere classified: Secondary | ICD-10-CM | POA: Diagnosis present

## 2023-03-15 DIAGNOSIS — G8929 Other chronic pain: Secondary | ICD-10-CM | POA: Insufficient documentation

## 2023-03-15 DIAGNOSIS — M5441 Lumbago with sciatica, right side: Secondary | ICD-10-CM | POA: Diagnosis present

## 2023-03-15 DIAGNOSIS — M5442 Lumbago with sciatica, left side: Secondary | ICD-10-CM | POA: Diagnosis not present

## 2023-03-15 NOTE — Therapy (Signed)
OUTPATIENT PHYSICAL THERAPY THORACOLUMBAR TREATMENT   Patient Name: Micheal Lucas MRN: [id number] DOB:1956/01/12, 67 y.o., male Today's Date: 03/15/2023  END OF SESSION:  PT End of Session - 03/15/23 0829     Visit Number 11    Date for PT Re-Evaluation 04/24/23    Progress Note Due on Visit 20    PT Start Time 0815    PT Stop Time 0855    PT Time Calculation (min) 40 min    Activity Tolerance Patient tolerated treatment well    Behavior During Therapy [facility] for tasks assessed/performed                Past Medical History:  Diagnosis Date   Anemia    Arthritis    oa   Bruit of right carotid artery    Cataract    right eye   Elevated cholesterol    GERD (gastroesophageal reflux disease)    Hypertension    Plantar fascial fibromatosis    Past Surgical History:  Procedure Laterality Date   colonscopy  age 10   EYE SURGERY Left 04/10/2016   cataract surgery   EYE SURGERY Bilateral 2015   stents   knee scope and cartlidge implant Left 2009   TOTAL KNEE ARTHROPLASTY Right 05/23/2016   Procedure: RIGHT TOTAL KNEE ARTHROPLASTY;  Surgeon: Durene Romans, MD;  Location: [facility];  Service: Orthopedics;  Laterality: Right;  Adductor block   TOTAL KNEE REVISION Right 10/19/2022   Procedure: TOTAL KNEE REVISION;  Surgeon: Durene Romans, MD;  Location: [facility];  Service: Orthopedics;  Laterality: Right;   Patient Active Problem List   Diagnosis Date Noted   S/P revision of total knee, 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   Obese 05/24/2016   S/P right TKA 05/23/2016   S/P knee replacement 05/23/2016    PCP: Mattie Marlin , MD  REFERRING PROVIDER: Venita Lick  REFERRING DIAG: chronic lower back pain  Rationale for Evaluation and Treatment: Rehabilitation  THERAPY DIAG:  Chronic bilateral low back pain with bilateral sciatica  Difficulty in walking, not elsewhere classified  ONSET DATE: 4 years, since 2020  SUBJECTIVE:                                                                                                                                                                                            SUBJECTIVE STATEMENT: Pt states, "I was pretty good after the needling, ice and exercises".   PERTINENT HISTORY:  Chronic lower back pain, revision R TKA 3 months ago.  Recovered from TKA, now referred to PT , MD specifically requested AQUATIC PT  PAIN:  Are you having pain? Yes:  NPRS scale: 5/10    (and 2/10 in Rt knee) Pain location: lower back radiating into buttocks Pain description: dull  Aggravating factors: certain activities Relieving factors: none  PRECAUTIONS: None  WEIGHT BEARING RESTRICTIONS: No  FALLS:  Has patient fallen in last 6 months? No  LIVING ENVIRONMENT: Lives with: lives with their spouse Lives in: House/apartment Stairs: Yes: External: 2 steps; on left going up Has following equipment at home: Single point cane and Walker - 2 wheeled  OCCUPATION: retired  PLOF: Independent  PATIENT GOALS: Improve my mobility, gait, pain level, be able to sleep  NEXT MD VISIT: TBD  OBJECTIVE:   DIAGNOSTIC FINDINGS: MRI  May 2024:  IMPRESSION: 1. At L4-L5 there is moderate bilateral subarticular recess stenosis without substantial central canal stenosis. 2. Mild foraminal stenosis bilaterally at L3-L4 and L4-L5.  PATIENT SURVEYS:  Modified Oswestry 29/50   SCREENING FOR RED FLAGS: Bowel or bladder incontinence: No Spinal tumors: No Cauda equina syndrome: No Compression fracture: No Abdominal aneurysm: No  COGNITION: Overall cognitive status: Within functional limits for tasks assessed     SENSATION: WFL  MUSCLE LENGTH: Hamstrings: Right -55 deg; Left wnl deg Prone knee flexion : Right -10 deg; Left -10 deg, reproduction of pain L$/5 region with overpressure   POSTURE: weight shift L, R knee mildly flexed  PALPATION: Tender , painful, reproduction of pain central segmental PA 's lower thoracic spine l T 9,to entire  LS  spine,spinous processes Pain B glut medius, maximus  LUMBAR ROM:   AROM eval  Flexion 75%  Extension 25%  Right lateral flexion 25%  Left lateral flexion 25%  Right rotation   Left rotation    (Blank rows = not tested)  LOWER EXTREMITY ROM:     All LE ROM wfl LOWER EXTREMITY MMT:   03/13/23:  MMT for B quads wnl  MMT Right eval Left eval  Hip flexion    Hip extension 4- 4-  Hip abduction 4- 4-  Hip adduction    Hip internal rotation    Hip external rotation    Knee flexion    Knee extension 4-   Ankle dorsiflexion HW wnl "  Ankle plantarflexion TW wnl "  Ankle inversion    Ankle eversion     (Blank rows = not tested)  LUMBAR SPECIAL TESTS:  Straight leg raise test: Positive, Slump test: Negative, and prone knee bend (L4) neural stretch with + LBP, familiar sign  FUNCTIONAL TESTS:  30 seconds chair stand test  7  GAIT: Distance walked: over 100' in clinic TODAY'S TREATMENT:                                                                                                                              DATE:  03/15/23 Pt seen for aquatic therapy today.  Treatment took place in water 3.5-4.75 ft in depth at the [facility] pool. Temp of water was 91.  Pt entered/exited the pool  via stairs independently with bilat rail.  * in 4+ft of water, no UE support:  walking forward/ backward,  * back against wall: 3 way LE stretch with ankle on hollow blue noodle R/L/R (10-15s per position) * quad stretch with ankle supported by hollow blue noodle x 3 reps of 15 sec * side step into wide squat with arm addct with yellow -> blue hand float * farmer carry: bilat yellow hand floats under water at side walking forward, * SLS with 3 way LE kick x 5 each LE, UE support on yellow hand floats, then leg swings into hip flex ext x 5 each leg  * light jog forward/ backward  * seated on 3rd step in water:  piriformis stretch x15s x 2 each LE; knee to chest stretch with feet on first  step, holding rails x 20s * plank on blue hand floats x 30 sec; superman to/from plank x 5; plank with alternating leg lifts x 5 each * repeat glute stretch at stairs; repeat light jog in deeper water * 3 way LE stretch with ankle supported by solid noodle  * straddling noodle and additional noodle under arms, cycling forward 3 laps  03/13/23:  Reassessed patient today and progress toward goals.   Therex:  Instructed in unilateral hip flexor stretch off bed with  UE press up , up to 1 min each side Instructed in supine thoracic roll stretch with roll ll to spine , combined with pec flys and B shoulder flexion, with light(4# st to adjunct stretch)  Manual: Assessed segmental glides lumbar and thoracic spine, lumbar tender, inflamed, thoracic with less mobility noted. PA jt mobs B post lower ribs and thoracic spine Trigger Point Dry-Needling  Treatment instructions: Expect mild to moderate muscle soreness. S/S of pneumothorax if dry needled over a lung field, and to seek immediate medical attention should they occur. Patient verbalized understanding of these instructions and education. Patient Consent Given: Yes Education handout provided: Previously provided Muscles treated: B L4 and L5 multifidi Treatment response/outcome: Twitch Response Elicited and Palpable Increase in Muscle Length  03/12/23  Pt seen for aquatic therapy today.  Treatment took place in water 3.5-4.75 ft in depth at the [facility] pool. Temp of water was 91.  Pt entered/exited the pool via stairs independently with bilat rail.  * in 4+ft of water, no UE support:  walking forward/ backward,  * back against wall: 3 way LE stretch with ankle on hollow blue noodle R/L/R (10-15s per position) * quad stretch with ankle supported by hollow blue noodle x 3 reps of 15 sec * side step into wide squat with arm addct with yellow hand float * 1 Leg superman with UE on yellow hand floats 2 sets of 5 * farmer carry: bilat  yellow hand floats under water at side walking forward, then reciprocal arm swing * SLS with 3 way LE kick x 10 each LE, UE support on yellow hand floats * TrA set with bilat rainbow hand float pull downs x 10 * staggered stance with kick board row 2 x 10 each LE forward (2nd set with slight vectors of 11 and 1  o'clock) * seated on 3rd step in water:  piriformis stretch x15s x 2 each LE * plank on steps with hip ext; plank on blue hand floats x 30 sec * straddling noodle and additional noodle under arms, cycling forward 3 laps   03/08/23  Pt seen for aquatic therapy today.  Treatment took place in  water 3.5-4.75 ft in depth at the [facility] pool. Temp of water was 91.  Pt entered/exited the pool via stairs independently with bilat rail.  * in 4+ft of water, no UE support:  walking forward/ backward, side stepping, forward walking kick * side step into wide squat with arm addct with yellow ->blue hand float * farmer carry: bilat yellow then single blue hand floats under water at side walking forward *squat with press out with yellow hand float (cues for vertical trunk and heels off floor when in squat position) * 1 Leg superman with UE on yellow hand floats 2 sets of 5 * straddling noodle and additional noodle under arms, cycling forward 3 laps * staggered stance with kick board row 2 x 10 each LE forward (2nd set with slight vectors of 11 and 1  o'clock) * TrA set with kick board push down (straight arms, soft knees) x 5 -> single/bilat rainbow hand float pull downs x 5 * back against wall: 3 way LE stretch with ankle on solid noodle R/L/R (10-15s per position) * seated on 3rd step in water:  piriformis stretch x15s x 2 each LE (LLE tight!),   03/05/23  Pt seen for aquatic therapy today.  Treatment took place in water 3.5-4.75 ft in depth at the [facility] pool. Temp of water was 91.  Pt entered/exited the pool via stairs independently with bilat rail.  * in 4+ft of  water, no UE support:  walking forward/ backward, then adding reciprocal arm swing;   * -> side step into wide squat with arm addct with yellow hand floats - 2 laps * farmer carry: bilat/ single hand floats under water at side walking forward/backward * 3 way LE kick with UE support on yellow floats, 3 x 5 reps * forward walking kicks (hip flex to LAQ) * back against wall: 3 way LE stretch with ankle on solid noodle R/L/R * straddling noodle and additional noodle under arms, cycling forward 3 laps * seated on 3rd step in water:  piriformis stretch x15s x 2 each LE (LLE tight!), and STS without UE support x 5   03/01/23  Pt seen for aquatic therapy today.  Treatment took place in water 3.5-4.75 ft in depth at the [facility] pool. Temp of water was 91.  Pt entered/exited the pool via stairs independently with bilat rail.  * in 4+ft of water, no UE support:  walking forward/ backward, then adding reciprocal arm swing;   * forward walking kicks (hip flex to LAQ)- cues for decrease force of kick * holding wall:  alternating single leg clams ; leg swings into hip flex/ext; hip abdct/ addct x 10 each * side stepping with arm addct, then with yellow hand floats -> side step into wide squat - 2 laps * farmer carry: bilat hand floats under water and side walking forward * holding yellow hand floats:  leg swings into hip flex/ext; hip abdct/ addct x 10 each; heel /toe raises x 10 * TrA set with solid noodle pull down to thighs x 10 with 2 isometrics holds prior to returning to surface * straddling noodle and additional noodle under arms, cycling forward * suspended supine position x 2 min for decompression   02/26/23 * reviewed HEP verbally (and with therapist demo)- minor modifications made   Pt seen for aquatic therapy today.  Treatment took place in water 3.5-4.75 ft in depth at the [facility] pool. Temp of water was 86-91.  Pt entered/exited  the pool via stairs independently  with bilat rail.  * in 4+ft of water, no UE support:  walking forward/ backward, then adding reciprocal arm swing;   * side stepping with arm addct, then with yellow hand floats -> side step into wide squat  * in therapy pool -> lap pool :* suspended cycling forward (yellow noodle under arms/  yellow noodle between legs) breast stroke arms * TrA set with solid noodle pull down to thighs x 10 * holding wall:  alternating single leg clams ; leg swings into hip flex/ext; hip abdct/ addct x 10 each * forward walking kicks (hip flex to LAQ) * L stretch in shallow water, gentle wag the tail   02/21/23 Pt seen for aquatic therapy today.  Treatment took place in water 3.5-4.75 ft in depth at the [facility] pool. Temp of water was 91.  Pt entered/exited the pool via stairs independently with bilat rail. * intro to aquatic therapy principles * in 4+ft of water, no UE support:  walking forward/ backward, then adding reciprocal arm swing;   * side stepping with arm addct, then with rainbow hand floats  * farmer carry, with single/ bilat rainbow hand floats under water with TrA set, walking forward/ backward * suspended cycling forward (yellow noodle under arms/  yellow noodle between legs)  * TrA set with solid noodle pull down to thighs x 10 * 3 way LE stretch with solid noodle at ankle x 2 reps each LE  Pt requires the buoyancy and hydrostatic pressure of water for support, and to offload joints by unweighting joint load by at least 50 % in navel deep water and by at least 75-80% in chest to neck deep water.  Viscosity of the water is needed for resistance of strengthening. Water current perturbations provides challenge to standing balance requiring increased core activation.   02/06/23 Therapeutic Exercise: to improve strength and mobility.  Demo, verbal and tactile cues throughout for technique.  Rec Bike L1x18min Supine LTR x 10 bil Supine pelvic tilts x 10  Supine hamstring stretch  with strap 2x30 secs bil Supine figure 4 stretch and KTOS piriformis stretch 2x30 sec bil Bridge with GTB 2x10 with 5 seconds hold S/L clamshells GTB x 10 each side Supine march with TrA 5x bil;    PATIENT EDUCATION:  Education details: aquatic therapy intro  Person educated: Patient Education method: Explanation, Demonstration, Actor cues, Verbal cues Education comprehension: verbalized understanding, returned demonstration, and verbal cues required  HOME EXERCISE PROGRAM: Access Code: [email or web address] URL: [email or web address] Date: 02/06/2023 Prepared by: Verta Ellen  Exercises - Supine Figure 4 Piriformis Stretch  - 1 x daily - 7 x weekly - 3 sets - 30 sec hold - Supine Bridge   - 1 x daily - 7 x weekly - 2-3 sets - 10 reps - Clamshell with Resistance  - 1 x daily - 7 x weekly - 2-3 sets - 10 reps 01/30/23: demo'd to patient self pelvic traction by suspending over bed for sacral distraction.  AQUATIC Access Code: [email or web address] URL: [email or web address] Date: 03/08/2023 Prepared by: [facility] This aquatic home exercise program from Med[employer] utilizes pictures from land based exercises, but has been adapted prior to lamination and issuance.    ASSESSMENT:  CLINICAL IMPRESSION: Pt reported reduction of back pain to 3/10 during session.  Included progressions with blue hand floats with good tolerance. Will continue aquatic PT and patient to transition in the next few weeks to a pool  locally with silver sneakers. He is to return again for PT assessment on visit 20, or once d/c from aquatic therapy.  Pt will continue to benefit from skilled PT to address his deficits and improve his mobility.    OBJECTIVE IMPAIRMENTS: decreased activity tolerance, decreased ROM, decreased strength, hypomobility, increased muscle spasms, impaired flexibility, and pain.   ACTIVITY LIMITATIONS: carrying, bending, sitting, standing, squatting,  sleeping, and bed mobility  PARTICIPATION LIMITATIONS: laundry, shopping, community activity, and yard work  PERSONAL FACTORS: Behavior pattern, Past/current experiences, Time since onset of injury/illness/exacerbation, and 1-2 comorbidities: recent R TKA revision,HTN  are also affecting patient's functional outcome.   REHAB POTENTIAL: Fair    CLINICAL DECISION MAKING: Stable/uncomplicated  EVALUATION COMPLEXITY: Low   GOALS: Goals reviewed with patient? Yes  SHORT TERM GOALS: Target date: 2 weeks through 02/13/23  I HEP Baseline:initiated  Goal status: INITIAL  LONG TERM GOALS: Target date: 04/24/23  Modified oswestry impove to 19/50 Baseline: 29/50 Goal status: IN PROGRESS 03/13/23 : 19/50  2.  Improve R hamstrings flexibility to -30 Baseline: -60 Goal status: MET -15 today  3.  Improve 30 sec sit to stand from  7 to12 Baseline: 7 Goal status: IN PROGRESS 03/13/23: 11   PLAN:  PT FREQUENCY: 3x/week  PT DURATION: 12 weeks  PLANNED INTERVENTIONS: Therapeutic exercises, Therapeutic activity, Neuromuscular re-education, Balance training, Gait training, Patient/Family education, Self Care, and Joint mobilization.aquatic PT  PLAN FOR NEXT SESSION: aquatic therapy; reassess for dry needling or other pain mangement modalities, add more LS stretching, therex.    Mayer Camel, PTA 03/15/23 9:12 AM [facility] [address] [place], [place], [address] Phone: [phone]   Fax:  [phone]

## 2023-03-20 ENCOUNTER — Ambulatory Visit (HOSPITAL_BASED_OUTPATIENT_CLINIC_OR_DEPARTMENT_OTHER): Payer: BC Managed Care – PPO | Admitting: Physical Therapy

## 2023-03-22 ENCOUNTER — Ambulatory Visit (HOSPITAL_BASED_OUTPATIENT_CLINIC_OR_DEPARTMENT_OTHER): Payer: Medicare Other | Admitting: Physical Therapy

## 2023-03-22 ENCOUNTER — Encounter (HOSPITAL_BASED_OUTPATIENT_CLINIC_OR_DEPARTMENT_OTHER): Payer: Self-pay | Admitting: Physical Therapy

## 2023-03-22 DIAGNOSIS — M5442 Lumbago with sciatica, left side: Secondary | ICD-10-CM | POA: Diagnosis not present

## 2023-03-22 DIAGNOSIS — G8929 Other chronic pain: Secondary | ICD-10-CM

## 2023-03-22 DIAGNOSIS — R262 Difficulty in walking, not elsewhere classified: Secondary | ICD-10-CM

## 2023-03-22 NOTE — Therapy (Signed)
OUTPATIENT PHYSICAL THERAPY THORACOLUMBAR TREATMENT   Patient Name: Micheal Lucas MRN: [id number] DOB:12-Nov-1955, 67 y.o., male Today's Date: 03/22/2023  END OF SESSION:  PT End of Session - 03/22/23 4098     Visit Number 12    Date for PT Re-Evaluation 04/24/23    Progress Note Due on Visit 20    PT Start Time 0811    PT Stop Time 0851    PT Time Calculation (min) 40 min    Activity Tolerance Patient tolerated treatment well    Behavior During Therapy [facility] for tasks assessed/performed                Past Medical History:  Diagnosis Date   Anemia    Arthritis    oa   Bruit of right carotid artery    Cataract    right eye   Elevated cholesterol    GERD (gastroesophageal reflux disease)    Hypertension    Plantar fascial fibromatosis    Past Surgical History:  Procedure Laterality Date   colonscopy  age 38   EYE SURGERY Left 04/10/2016   cataract surgery   EYE SURGERY Bilateral 2015   stents   knee scope and cartlidge implant Left 2009   TOTAL KNEE ARTHROPLASTY Right 05/23/2016   Procedure: RIGHT TOTAL KNEE ARTHROPLASTY;  Surgeon: Durene Romans, MD;  Location: [facility];  Service: Orthopedics;  Laterality: Right;  Adductor block   TOTAL KNEE REVISION Right 10/19/2022   Procedure: TOTAL KNEE REVISION;  Surgeon: Durene Romans, MD;  Location: [facility];  Service: Orthopedics;  Laterality: Right;   Patient Active Problem List   Diagnosis Date Noted   S/P revision of total knee, 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   Obese 05/24/2016   S/P right TKA 05/23/2016   S/P knee replacement 05/23/2016    PCP: Mattie Marlin , MD  REFERRING PROVIDER: Venita Lick  REFERRING DIAG: chronic lower back pain  Rationale for Evaluation and Treatment: Rehabilitation  THERAPY DIAG:  Chronic bilateral low back pain with bilateral sciatica  Difficulty in walking, not elsewhere classified  ONSET DATE: 4 years, since 2020  SUBJECTIVE:                                                                                                                                                                                            SUBJECTIVE STATEMENT: Pt states, "I'm feeling pretty good".   Pt reports that his insurance is sending him a new silver sneakers card, so he plans to join one of the [employer] soon.    PERTINENT HISTORY:  Chronic lower back pain, revision R TKA 3 months ago.  Recovered  from TKA, now referred to PT , MD specifically requested AQUATIC PT  PAIN:  Are you having pain? Yes: NPRS scale: 5/10    (and 2-3/10 in Rt knee) Pain location: lower back radiating into buttocks Pain description: dull  Aggravating factors: certain activities Relieving factors: none  PRECAUTIONS: None  WEIGHT BEARING RESTRICTIONS: No  FALLS:  Has patient fallen in last 6 months? No  LIVING ENVIRONMENT: Lives with: lives with their spouse Lives in: House/apartment Stairs: Yes: External: 2 steps; on left going up Has following equipment at home: Single point cane and Walker - 2 wheeled  OCCUPATION: retired  PLOF: Independent  PATIENT GOALS: Improve my mobility, gait, pain level, be able to sleep  NEXT MD VISIT: TBD  OBJECTIVE:   DIAGNOSTIC FINDINGS: MRI  May 2024:  IMPRESSION: 1. At L4-L5 there is moderate bilateral subarticular recess stenosis without substantial central canal stenosis. 2. Mild foraminal stenosis bilaterally at L3-L4 and L4-L5.  PATIENT SURVEYS:  Modified Oswestry 29/50   SCREENING FOR RED FLAGS: Bowel or bladder incontinence: No Spinal tumors: No Cauda equina syndrome: No Compression fracture: No Abdominal aneurysm: No  COGNITION: Overall cognitive status: Within functional limits for tasks assessed     SENSATION: WFL  MUSCLE LENGTH: Hamstrings: Right -55 deg; Left wnl deg Prone knee flexion : Right -10 deg; Left -10 deg, reproduction of pain L$/5 region with overpressure   POSTURE: weight shift L, R knee mildly flexed  PALPATION: Tender , painful,  reproduction of pain central segmental PA 's lower thoracic spine l T 9,to entire  LS spine,spinous processes Pain B glut medius, maximus  LUMBAR ROM:   AROM eval  Flexion 75%  Extension 25%  Right lateral flexion 25%  Left lateral flexion 25%  Right rotation   Left rotation    (Blank rows = not tested)  LOWER EXTREMITY ROM:     All LE ROM wfl LOWER EXTREMITY MMT:   03/13/23:  MMT for B quads wnl  MMT Right eval Left eval  Hip flexion    Hip extension 4- 4-  Hip abduction 4- 4-  Hip adduction    Hip internal rotation    Hip external rotation    Knee flexion    Knee extension 4-   Ankle dorsiflexion HW wnl "  Ankle plantarflexion TW wnl "  Ankle inversion    Ankle eversion     (Blank rows = not tested)  LUMBAR SPECIAL TESTS:  Straight leg raise test: Positive, Slump test: Negative, and prone knee bend (L4) neural stretch with + LBP, familiar sign  FUNCTIONAL TESTS:  30 seconds chair stand test  7  GAIT: Distance walked: over 100' in clinic TODAY'S TREATMENT:                                                                                                                              DATE:  03/15/23 Pt seen for aquatic therapy today.  Treatment took place in  water 3.5-4.75 ft in depth at the [facility] pool. Temp of water was 86-91.  Pt entered/exited the pool via stairs independently with bilat rail.  *prior to session beginning in therapeutic pool, pt completed: walking forward/ backward, 3 way LE stretch with hollow noodle  *switched to lap pool (86): side stepping with med resistance band on thighs R/L 68ft x 2 laps * hip hinge with forward arm reach with yellow hand floats x 10 * forward march with knee taps to yellow hand floats  * single leg superman 2 sets of 5 reps  * light jog forward/ backward  * farmer carry: bilat yellow hand floats under water at side walking backward * side step into wide squat with arm addct with blue hand float * plank  on blue hand floats x 30 sec; superman to/from plank x 5; plank with alternating leg lifts x 5 each * return to jog backwards / forwards * quad stretch with ankle supported by solid noodle x 30sec each LE  03/15/23 Pt seen for aquatic therapy today.  Treatment took place in water 3.5-4.75 ft in depth at the [facility] pool. Temp of water was 91.  Pt entered/exited the pool via stairs independently with bilat rail.  * in 4+ft of water, no UE support:  walking forward/ backward,  * back against wall: 3 way LE stretch with ankle on hollow blue noodle R/L/R (10-15s per position) * quad stretch with ankle supported by hollow blue noodle x 3 reps of 15 sec * side step into wide squat with arm addct with yellow -> blue hand float * farmer carry: bilat yellow hand floats under water at side walking forward, * SLS with 3 way LE kick x 5 each LE, UE support on yellow hand floats, then leg swings into hip flex ext x 5 each leg  * light jog forward/ backward  * seated on 3rd step in water:  piriformis stretch x15s x 2 each LE; knee to chest stretch with feet on first step, holding rails x 20s * plank on blue hand floats x 30 sec; superman to/from plank x 5; plank with alternating leg lifts x 5 each * repeat glute stretch at stairs; repeat light jog in deeper water * 3 way LE stretch with ankle supported by solid noodle  * straddling noodle and additional noodle under arms, cycling forward 3 laps  03/13/23:  Reassessed patient today and progress toward goals.   Therex:  Instructed in unilateral hip flexor stretch off bed with  UE press up , up to 1 min each side Instructed in supine thoracic roll stretch with roll ll to spine , combined with pec flys and B shoulder flexion, with light(4# st to adjunct stretch)  Manual: Assessed segmental glides lumbar and thoracic spine, lumbar tender, inflamed, thoracic with less mobility noted. PA jt mobs B post lower ribs and thoracic spine Trigger Point  Dry-Needling  Treatment instructions: Expect mild to moderate muscle soreness. S/S of pneumothorax if dry needled over a lung field, and to seek immediate medical attention should they occur. Patient verbalized understanding of these instructions and education. Patient Consent Given: Yes Education handout provided: Previously provided Muscles treated: B L4 and L5 multifidi Treatment response/outcome: Twitch Response Elicited and Palpable Increase in Muscle Length  03/12/23  Pt seen for aquatic therapy today.  Treatment took place in water 3.5-4.75 ft in depth at the [facility] pool. Temp of water was 91.  Pt entered/exited the pool via stairs  independently with bilat rail.  * in 4+ft of water, no UE support:  walking forward/ backward,  * back against wall: 3 way LE stretch with ankle on hollow blue noodle R/L/R (10-15s per position) * quad stretch with ankle supported by hollow blue noodle x 3 reps of 15 sec * side step into wide squat with arm addct with yellow hand float * 1 Leg superman with UE on yellow hand floats 2 sets of 5 * farmer carry: bilat yellow hand floats under water at side walking forward, then reciprocal arm swing * SLS with 3 way LE kick x 10 each LE, UE support on yellow hand floats * TrA set with bilat rainbow hand float pull downs x 10 * staggered stance with kick board row 2 x 10 each LE forward (2nd set with slight vectors of 11 and 1  o'clock) * seated on 3rd step in water:  piriformis stretch x15s x 2 each LE * plank on steps with hip ext; plank on blue hand floats x 30 sec * straddling noodle and additional noodle under arms, cycling forward 3 laps   03/08/23  Pt seen for aquatic therapy today.  Treatment took place in water 3.5-4.75 ft in depth at the [facility] pool. Temp of water was 91.  Pt entered/exited the pool via stairs independently with bilat rail.  * in 4+ft of water, no UE support:  walking forward/ backward, side stepping,  forward walking kick * side step into wide squat with arm addct with yellow ->blue hand float * farmer carry: bilat yellow then single blue hand floats under water at side walking forward *squat with press out with yellow hand float (cues for vertical trunk and heels off floor when in squat position) * 1 Leg superman with UE on yellow hand floats 2 sets of 5 * straddling noodle and additional noodle under arms, cycling forward 3 laps * staggered stance with kick board row 2 x 10 each LE forward (2nd set with slight vectors of 11 and 1  o'clock) * TrA set with kick board push down (straight arms, soft knees) x 5 -> single/bilat rainbow hand float pull downs x 5 * back against wall: 3 way LE stretch with ankle on solid noodle R/L/R (10-15s per position) * seated on 3rd step in water:  piriformis stretch x15s x 2 each LE (LLE tight!),   03/05/23  Pt seen for aquatic therapy today.  Treatment took place in water 3.5-4.75 ft in depth at the [facility] pool. Temp of water was 91.  Pt entered/exited the pool via stairs independently with bilat rail.  * in 4+ft of water, no UE support:  walking forward/ backward, then adding reciprocal arm swing;   * -> side step into wide squat with arm addct with yellow hand floats - 2 laps * farmer carry: bilat/ single hand floats under water at side walking forward/backward * 3 way LE kick with UE support on yellow floats, 3 x 5 reps * forward walking kicks (hip flex to LAQ) * back against wall: 3 way LE stretch with ankle on solid noodle R/L/R * straddling noodle and additional noodle under arms, cycling forward 3 laps * seated on 3rd step in water:  piriformis stretch x15s x 2 each LE (LLE tight!), and STS without UE support x 5   03/01/23  Pt seen for aquatic therapy today.  Treatment took place in water 3.5-4.75 ft in depth at the [facility] pool. Temp of water  was 91.  Pt entered/exited the pool via stairs independently with bilat  rail.  * in 4+ft of water, no UE support:  walking forward/ backward, then adding reciprocal arm swing;   * forward walking kicks (hip flex to LAQ)- cues for decrease force of kick * holding wall:  alternating single leg clams ; leg swings into hip flex/ext; hip abdct/ addct x 10 each * side stepping with arm addct, then with yellow hand floats -> side step into wide squat - 2 laps * farmer carry: bilat hand floats under water and side walking forward * holding yellow hand floats:  leg swings into hip flex/ext; hip abdct/ addct x 10 each; heel /toe raises x 10 * TrA set with solid noodle pull down to thighs x 10 with 2 isometrics holds prior to returning to surface * straddling noodle and additional noodle under arms, cycling forward * suspended supine position x 2 min for decompression   02/26/23 * reviewed HEP verbally (and with therapist demo)- minor modifications made   Pt seen for aquatic therapy today.  Treatment took place in water 3.5-4.75 ft in depth at the [facility] pool. Temp of water was 86-91.  Pt entered/exited the pool via stairs independently with bilat rail.  * in 4+ft of water, no UE support:  walking forward/ backward, then adding reciprocal arm swing;   * side stepping with arm addct, then with yellow hand floats -> side step into wide squat  * in therapy pool -> lap pool :* suspended cycling forward (yellow noodle under arms/  yellow noodle between legs) breast stroke arms * TrA set with solid noodle pull down to thighs x 10 * holding wall:  alternating single leg clams ; leg swings into hip flex/ext; hip abdct/ addct x 10 each * forward walking kicks (hip flex to LAQ) * L stretch in shallow water, gentle wag the tail   02/21/23 Pt seen for aquatic therapy today.  Treatment took place in water 3.5-4.75 ft in depth at the [facility] pool. Temp of water was 91.  Pt entered/exited the pool via stairs independently with bilat rail. * intro to aquatic  therapy principles * in 4+ft of water, no UE support:  walking forward/ backward, then adding reciprocal arm swing;   * side stepping with arm addct, then with rainbow hand floats  * farmer carry, with single/ bilat rainbow hand floats under water with TrA set, walking forward/ backward * suspended cycling forward (yellow noodle under arms/  yellow noodle between legs)  * TrA set with solid noodle pull down to thighs x 10 * 3 way LE stretch with solid noodle at ankle x 2 reps each LE  Pt requires the buoyancy and hydrostatic pressure of water for support, and to offload joints by unweighting joint load by at least 50 % in navel deep water and by at least 75-80% in chest to neck deep water.  Viscosity of the water is needed for resistance of strengthening. Water current perturbations provides challenge to standing balance requiring increased core activation.   02/06/23 Therapeutic Exercise: to improve strength and mobility.  Demo, verbal and tactile cues throughout for technique.  Rec Bike L1x25min Supine LTR x 10 bil Supine pelvic tilts x 10  Supine hamstring stretch with strap 2x30 secs bil Supine figure 4 stretch and KTOS piriformis stretch 2x30 sec bil Bridge with GTB 2x10 with 5 seconds hold S/L clamshells GTB x 10 each side Supine march with TrA 5x bil;  PATIENT EDUCATION:  Education details: aquatic therapy exercise modifications and progressions Person educated: Patient Education method: Explanation, Demonstration, Tactile cues, Verbal cues Education comprehension: verbalized understanding, returned demonstration, and verbal cues required  HOME EXERCISE PROGRAM: Access Code: [email or web address] URL: [email or web address] Date: 02/06/2023 Prepared by: Verta Ellen  Exercises - Supine Figure 4 Piriformis Stretch  - 1 x daily - 7 x weekly - 3 sets - 30 sec hold - Supine Bridge   - 1 x daily - 7 x weekly - 2-3 sets - 10 reps - Clamshell with Resistance  - 1 x daily - 7 x  weekly - 2-3 sets - 10 reps 01/30/23: demo'd to patient self pelvic traction by suspending over bed for sacral distraction.  AQUATIC Access Code: [email or web address] URL: [email or web address] Date: 03/08/2023 Prepared by: [facility] [address] This aquatic home exercise program from [employer] utilizes pictures from land based exercises, but has been adapted prior to lamination and issuance.    ASSESSMENT:  CLINICAL IMPRESSION: Pt reported reduction of back pain to 2-3/10 in lower back during session.  Completed most of session in lap pool, where the temp is closer to what he will experience at [facility].  Progressing well towards remaining goals. Next visit will finalize aquatic HEP and d/c from aquatic therapy.    He is to return again for PT assessment on visit 20, or once d/c from aquatic therapy.  Pt will continue to benefit from skilled PT to address his deficits and improve his mobility.    OBJECTIVE IMPAIRMENTS: decreased activity tolerance, decreased ROM, decreased strength, hypomobility, increased muscle spasms, impaired flexibility, and pain.   ACTIVITY LIMITATIONS: carrying, bending, sitting, standing, squatting, sleeping, and bed mobility  PARTICIPATION LIMITATIONS: laundry, shopping, community activity, and yard work  PERSONAL FACTORS: Behavior pattern, Past/current experiences, Time since onset of injury/illness/exacerbation, and 1-2 comorbidities: recent R TKA revision,HTN  are also affecting patient's functional outcome.   REHAB POTENTIAL: Fair    CLINICAL DECISION MAKING: Stable/uncomplicated  EVALUATION COMPLEXITY: Low   GOALS: Goals reviewed with patient? Yes  SHORT TERM GOALS: Target date: 2 weeks through 02/13/23  I HEP Baseline:initiated  Goal status: INITIAL  LONG TERM GOALS: Target date: 04/24/23  Modified oswestry impove to 19/50 Baseline: 29/50 Goal status: IN PROGRESS 03/13/23 : 19/50  2.  Improve R hamstrings flexibility to  -30 Baseline: -60 Goal status: MET -15 today  3.  Improve 30 sec sit to stand from  7 to12 Baseline: 7 Goal status: IN PROGRESS 03/13/23: 11   PLAN:  PT FREQUENCY: 3x/week  PT DURATION: 12 weeks  PLANNED INTERVENTIONS: Therapeutic exercises, Therapeutic activity, Neuromuscular re-education, Balance training, Gait training, Patient/Family education, Self Care, and Joint mobilization.aquatic PT  PLAN FOR NEXT SESSION: aquatic therapy; reassess for dry needling or other pain mangement modalities, add more LS stretching, therex.    Mayer Camel, PTA 03/22/23 9:26 AM [facility] [address] [place], [place], [address] Phone: [phone]   Fax:  [phone]

## 2023-03-27 ENCOUNTER — Ambulatory Visit (HOSPITAL_BASED_OUTPATIENT_CLINIC_OR_DEPARTMENT_OTHER): Payer: BC Managed Care – PPO | Admitting: Physical Therapy

## 2023-03-29 ENCOUNTER — Ambulatory Visit: Payer: BC Managed Care – PPO

## 2023-04-02 ENCOUNTER — Ambulatory Visit (HOSPITAL_BASED_OUTPATIENT_CLINIC_OR_DEPARTMENT_OTHER): Payer: Medicare Other | Admitting: Physical Therapy

## 2023-04-02 ENCOUNTER — Encounter (HOSPITAL_BASED_OUTPATIENT_CLINIC_OR_DEPARTMENT_OTHER): Payer: Self-pay | Admitting: Physical Therapy

## 2023-04-02 ENCOUNTER — Ambulatory Visit (HOSPITAL_BASED_OUTPATIENT_CLINIC_OR_DEPARTMENT_OTHER): Payer: BC Managed Care – PPO | Admitting: Physical Therapy

## 2023-04-02 DIAGNOSIS — R262 Difficulty in walking, not elsewhere classified: Secondary | ICD-10-CM

## 2023-04-02 DIAGNOSIS — M5442 Lumbago with sciatica, left side: Secondary | ICD-10-CM | POA: Diagnosis not present

## 2023-04-02 DIAGNOSIS — G8929 Other chronic pain: Secondary | ICD-10-CM

## 2023-04-02 NOTE — Therapy (Signed)
OUTPATIENT PHYSICAL THERAPY THORACOLUMBAR TREATMENT   Patient Name: Micheal Lucas MRN: [id number] DOB:10/22/1955, 67 y.o., male Today's Date: 04/02/2023  END OF SESSION:  PT End of Session - 04/02/23 0826     Visit Number 13    Date for PT Re-Evaluation 04/24/23    Progress Note Due on Visit 20    PT Start Time 0812    PT Stop Time 0855    PT Time Calculation (min) 43 min    Activity Tolerance Patient tolerated treatment well    Behavior During Therapy [facility] for tasks assessed/performed                Past Medical History:  Diagnosis Date   Anemia    Arthritis    oa   Bruit of right carotid artery    Cataract    right eye   Elevated cholesterol    GERD (gastroesophageal reflux disease)    Hypertension    Plantar fascial fibromatosis    Past Surgical History:  Procedure Laterality Date   colonscopy  age 82   EYE SURGERY Left 04/10/2016   cataract surgery   EYE SURGERY Bilateral 2015   stents   knee scope and cartlidge implant Left 2009   TOTAL KNEE ARTHROPLASTY Right 05/23/2016   Procedure: RIGHT TOTAL KNEE ARTHROPLASTY;  Surgeon: Durene Romans, MD;  Location: [facility];  Service: Orthopedics;  Laterality: Right;  Adductor block   TOTAL KNEE REVISION Right 10/19/2022   Procedure: TOTAL KNEE REVISION;  Surgeon: Durene Romans, MD;  Location: [facility];  Service: Orthopedics;  Laterality: Right;   Patient Active Problem List   Diagnosis Date Noted   S/P revision of total knee, 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   Obese 05/24/2016   S/P right TKA 05/23/2016   S/P knee replacement 05/23/2016    PCP: Mattie Marlin , MD  REFERRING PROVIDER: Venita Lick  REFERRING DIAG: chronic lower back pain  Rationale for Evaluation and Treatment: Rehabilitation  THERAPY DIAG:  Chronic bilateral low back pain with bilateral sciatica  Difficulty in walking, not elsewhere classified  ONSET DATE: 4 years, since 2020  SUBJECTIVE:                                                                                                                                                                                            SUBJECTIVE STATEMENT: Pt reports that he joined the [facility] and went 5x last week.  Pain in back is much less and is no longer down his leg.  Pt requests to d/c after today's visit.   PERTINENT HISTORY:  Chronic lower back pain, revision R TKA 3 months ago.  Recovered from TKA, now referred to PT , MD specifically requested AQUATIC PT  PAIN:  Are you having pain? Yes: NPRS scale: 2/10    (and 3/10 in Rt knee) Pain location: lower back  Pain description: dull  Aggravating factors: certain activities Relieving factors: none  PRECAUTIONS: None  WEIGHT BEARING RESTRICTIONS: No  FALLS:  Has patient fallen in last 6 months? No  LIVING ENVIRONMENT: Lives with: lives with their spouse Lives in: House/apartment Stairs: Yes: External: 2 steps; on left going up Has following equipment at home: Single point cane and Walker - 2 wheeled  OCCUPATION: retired  PLOF: Independent  PATIENT GOALS: Improve my mobility, gait, pain level, be able to sleep  NEXT MD VISIT: TBD  OBJECTIVE:   DIAGNOSTIC FINDINGS: MRI  May 2024:  IMPRESSION: 1. At L4-L5 there is moderate bilateral subarticular recess stenosis without substantial central canal stenosis. 2. Mild foraminal stenosis bilaterally at L3-L4 and L4-L5.  PATIENT SURVEYS:  Modified Oswestry 29/50  04/02/23: 10/50  SCREENING FOR RED FLAGS: Bowel or bladder incontinence: No Spinal tumors: No Cauda equina syndrome: No Compression fracture: No Abdominal aneurysm: No  COGNITION: Overall cognitive status: Within functional limits for tasks assessed     SENSATION: WFL  MUSCLE LENGTH: Hamstrings: Right -55 deg; Left wnl deg Prone knee flexion : Right -10 deg; Left -10 deg, reproduction of pain L$/5 region with overpressure   POSTURE: weight shift L, R knee mildly flexed  PALPATION: Tender , painful,  reproduction of pain central segmental PA 's lower thoracic spine l T 9,to entire  LS spine,spinous processes Pain B glut medius, maximus  LUMBAR ROM:   AROM eval  Flexion 75%  Extension 25%  Right lateral flexion 25%  Left lateral flexion 25%  Right rotation   Left rotation    (Blank rows = not tested)  LOWER EXTREMITY ROM:     All LE ROM wfl LOWER EXTREMITY MMT:   03/13/23:  MMT for B quads wnl  MMT Right eval Left eval  Hip flexion    Hip extension 4- 4-  Hip abduction 4- 4-  Hip adduction    Hip internal rotation    Hip external rotation    Knee flexion    Knee extension 4-   Ankle dorsiflexion HW wnl "  Ankle plantarflexion TW wnl "  Ankle inversion    Ankle eversion     (Blank rows = not tested)  LUMBAR SPECIAL TESTS:  Straight leg raise test: Positive, Slump test: Negative, and prone knee bend (L4) neural stretch with + LBP, familiar sign  FUNCTIONAL TESTS:  30 seconds chair stand test  7 04/02/23: 30s STS = 12  GAIT: Distance walked: over 100' in clinic TODAY'S TREATMENT:                                                                                                                              DATE:  04/02/23 Pt seen for aquatic therapy  today.  Treatment took place in water 3.5-4.75 ft in depth at the [facility] pool. Temp of water was 91.  Pt entered/exited the pool via stairs independently with bilat rail.  *prior to session beginning in therapeutic pool, pt completed: walking forward/ backward, 3 way LE stretch with hollow noodle, quad stretch, and STS on stairs   * side stepping into wide squat with arm addct with blue hand floats x 3 laps  * single leg superman blue hand floats x 10, cues to slow speed of return to neutral * farmer carry: bilat blue hand floats under water at side walking backward/forward, then swinging arms * 3 way LE kick with blue hand float support  * plank on blue hand floats x 10 sec; superman to/from plank x 10;  plank with alternating leg lifts x 5 each * yellow hand float support:  single leg clams  * straddling yellow noodle - cycling in deeper water  * 30s STS and modified oswestry    03/15/23 Pt seen for aquatic therapy today.  Treatment took place in water 3.5-4.75 ft in depth at the [facility] pool. Temp of water was 86-91.  Pt entered/exited the pool via stairs independently with bilat rail.  *prior to session beginning in therapeutic pool, pt completed: walking forward/ backward, 3 way LE stretch with hollow noodle  *switched to lap pool (86): side stepping with med resistance band on thighs R/L 61ft x 2 laps * hip hinge with forward arm reach with yellow hand floats x 10 * forward march with knee taps to yellow hand floats  * single leg superman 2 sets of 5 reps  * light jog forward/ backward  * farmer carry: bilat yellow hand floats under water at side walking backward * side step into wide squat with arm addct with blue hand float * plank on blue hand floats x 30 sec; superman to/from plank x 5; plank with alternating leg lifts x 5 each * return to jog backwards / forwards * quad stretch with ankle supported by solid noodle x 30sec each LE  03/15/23 Pt seen for aquatic therapy today.  Treatment took place in water 3.5-4.75 ft in depth at the [facility] pool. Temp of water was 91.  Pt entered/exited the pool via stairs independently with bilat rail.  * in 4+ft of water, no UE support:  walking forward/ backward,  * back against wall: 3 way LE stretch with ankle on hollow blue noodle R/L/R (10-15s per position) * quad stretch with ankle supported by hollow blue noodle x 3 reps of 15 sec * side step into wide squat with arm addct with yellow -> blue hand float * farmer carry: bilat yellow hand floats under water at side walking forward, * SLS with 3 way LE kick x 5 each LE, UE support on yellow hand floats, then leg swings into hip flex ext x 5 each leg  * light jog  forward/ backward  * seated on 3rd step in water:  piriformis stretch x15s x 2 each LE; knee to chest stretch with feet on first step, holding rails x 20s * plank on blue hand floats x 30 sec; superman to/from plank x 5; plank with alternating leg lifts x 5 each * repeat glute stretch at stairs; repeat light jog in deeper water * 3 way LE stretch with ankle supported by solid noodle  * straddling noodle and additional noodle under arms, cycling forward 3 laps  03/13/23:  Reassessed patient  today and progress toward goals.   Therex:  Instructed in unilateral hip flexor stretch off bed with  UE press up , up to 1 min each side Instructed in supine thoracic roll stretch with roll ll to spine , combined with pec flys and B shoulder flexion, with light(4# st to adjunct stretch)  Manual: Assessed segmental glides lumbar and thoracic spine, lumbar tender, inflamed, thoracic with less mobility noted. PA jt mobs B post lower ribs and thoracic spine Trigger Point Dry-Needling  Treatment instructions: Expect mild to moderate muscle soreness. S/S of pneumothorax if dry needled over a lung field, and to seek immediate medical attention should they occur. Patient verbalized understanding of these instructions and education. Patient Consent Given: Yes Education handout provided: Previously provided Muscles treated: B L4 and L5 multifidi Treatment response/outcome: Twitch Response Elicited and Palpable Increase in Muscle Length  03/12/23  Pt seen for aquatic therapy today.  Treatment took place in water 3.5-4.75 ft in depth at the [facility] pool. Temp of water was 91.  Pt entered/exited the pool via stairs independently with bilat rail.  * in 4+ft of water, no UE support:  walking forward/ backward,  * back against wall: 3 way LE stretch with ankle on hollow blue noodle R/L/R (10-15s per position) * quad stretch with ankle supported by hollow blue noodle x 3 reps of 15 sec * side step into  wide squat with arm addct with yellow hand float * 1 Leg superman with UE on yellow hand floats 2 sets of 5 * farmer carry: bilat yellow hand floats under water at side walking forward, then reciprocal arm swing * SLS with 3 way LE kick x 10 each LE, UE support on yellow hand floats * TrA set with bilat rainbow hand float pull downs x 10 * staggered stance with kick board row 2 x 10 each LE forward (2nd set with slight vectors of 11 and 1  o'clock) * seated on 3rd step in water:  piriformis stretch x15s x 2 each LE * plank on steps with hip ext; plank on blue hand floats x 30 sec * straddling noodle and additional noodle under arms, cycling forward 3 laps   03/08/23  Pt seen for aquatic therapy today.  Treatment took place in water 3.5-4.75 ft in depth at the [facility] pool. Temp of water was 91.  Pt entered/exited the pool via stairs independently with bilat rail.  * in 4+ft of water, no UE support:  walking forward/ backward, side stepping, forward walking kick * side step into wide squat with arm addct with yellow ->blue hand float * farmer carry: bilat yellow then single blue hand floats under water at side walking forward *squat with press out with yellow hand float (cues for vertical trunk and heels off floor when in squat position) * 1 Leg superman with UE on yellow hand floats 2 sets of 5 * straddling noodle and additional noodle under arms, cycling forward 3 laps * staggered stance with kick board row 2 x 10 each LE forward (2nd set with slight vectors of 11 and 1  o'clock) * TrA set with kick board push down (straight arms, soft knees) x 5 -> single/bilat rainbow hand float pull downs x 5 * back against wall: 3 way LE stretch with ankle on solid noodle R/L/R (10-15s per position) * seated on 3rd step in water:  piriformis stretch x15s x 2 each LE (LLE tight!),   03/05/23  Pt seen for aquatic  therapy today.  Treatment took place in water 3.5-4.75 ft in depth at the  [facility] pool. Temp of water was 91.  Pt entered/exited the pool via stairs independently with bilat rail.  * in 4+ft of water, no UE support:  walking forward/ backward, then adding reciprocal arm swing;   * -> side step into wide squat with arm addct with yellow hand floats - 2 laps * farmer carry: bilat/ single hand floats under water at side walking forward/backward * 3 way LE kick with UE support on yellow floats, 3 x 5 reps * forward walking kicks (hip flex to LAQ) * back against wall: 3 way LE stretch with ankle on solid noodle R/L/R * straddling noodle and additional noodle under arms, cycling forward 3 laps * seated on 3rd step in water:  piriformis stretch x15s x 2 each LE (LLE tight!), and STS without UE support x 5   03/01/23  Pt seen for aquatic therapy today.  Treatment took place in water 3.5-4.75 ft in depth at the [facility] pool. Temp of water was 91.  Pt entered/exited the pool via stairs independently with bilat rail.  * in 4+ft of water, no UE support:  walking forward/ backward, then adding reciprocal arm swing;   * forward walking kicks (hip flex to LAQ)- cues for decrease force of kick * holding wall:  alternating single leg clams ; leg swings into hip flex/ext; hip abdct/ addct x 10 each * side stepping with arm addct, then with yellow hand floats -> side step into wide squat - 2 laps * farmer carry: bilat hand floats under water and side walking forward * holding yellow hand floats:  leg swings into hip flex/ext; hip abdct/ addct x 10 each; heel /toe raises x 10 * TrA set with solid noodle pull down to thighs x 10 with 2 isometrics holds prior to returning to surface * straddling noodle and additional noodle under arms, cycling forward * suspended supine position x 2 min for decompression   02/26/23 * reviewed HEP verbally (and with therapist demo)- minor modifications made   Pt seen for aquatic therapy today.  Treatment took place in  water 3.5-4.75 ft in depth at the [facility] pool. Temp of water was 86-91.  Pt entered/exited the pool via stairs independently with bilat rail.  * in 4+ft of water, no UE support:  walking forward/ backward, then adding reciprocal arm swing;   * side stepping with arm addct, then with yellow hand floats -> side step into wide squat  * in therapy pool -> lap pool :* suspended cycling forward (yellow noodle under arms/  yellow noodle between legs) breast stroke arms * TrA set with solid noodle pull down to thighs x 10 * holding wall:  alternating single leg clams ; leg swings into hip flex/ext; hip abdct/ addct x 10 each * forward walking kicks (hip flex to LAQ) * L stretch in shallow water, gentle wag the tail   02/21/23 Pt seen for aquatic therapy today.  Treatment took place in water 3.5-4.75 ft in depth at the [facility] pool. Temp of water was 91.  Pt entered/exited the pool via stairs independently with bilat rail. * intro to aquatic therapy principles * in 4+ft of water, no UE support:  walking forward/ backward, then adding reciprocal arm swing;   * side stepping with arm addct, then with rainbow hand floats  * farmer carry, with single/ bilat rainbow hand floats under water with  TrA set, walking forward/ backward * suspended cycling forward (yellow noodle under arms/  yellow noodle between legs)  * TrA set with solid noodle pull down to thighs x 10 * 3 way LE stretch with solid noodle at ankle x 2 reps each LE  Pt requires the buoyancy and hydrostatic pressure of water for support, and to offload joints by unweighting joint load by at least 50 % in navel deep water and by at least 75-80% in chest to neck deep water.  Viscosity of the water is needed for resistance of strengthening. Water current perturbations provides challenge to standing balance requiring increased core activation.   02/06/23 Therapeutic Exercise: to improve strength and mobility.  Demo, verbal  and tactile cues throughout for technique.  Rec Bike L1x85min Supine LTR x 10 bil Supine pelvic tilts x 10  Supine hamstring stretch with strap 2x30 secs bil Supine figure 4 stretch and KTOS piriformis stretch 2x30 sec bil Bridge with GTB 2x10 with 5 seconds hold S/L clamshells GTB x 10 each side Supine march with TrA 5x bil;    PATIENT EDUCATION:  Education details: aquatic therapy exercise modifications and progressions Person educated: Patient Education method: Explanation, Demonstration, Tactile cues, Verbal cues Education comprehension: verbalized understanding, returned demonstration, and verbal cues required  HOME EXERCISE PROGRAM: Access Code: [email or web address] URL: [email or web address] Date: 02/06/2023 Prepared by: Verta Ellen  Exercises - Supine Figure 4 Piriformis Stretch  - 1 x daily - 7 x weekly - 3 sets - 30 sec hold - Supine Bridge   - 1 x daily - 7 x weekly - 2-3 sets - 10 reps - Clamshell with Resistance  - 1 x daily - 7 x weekly - 2-3 sets - 10 reps 01/30/23: demo'd to patient self pelvic traction by suspending over bed for sacral distraction.  AQUATIC Access Code: [email or web address] URL: [email or web address] Date: 03/08/2023 Prepared by: [facility] [address] This aquatic home exercise program from [employer] utilizes pictures from land based exercises, but has been adapted prior to lamination and issuance.    ASSESSMENT:  CLINICAL IMPRESSION: Positive response to aquatic therapy thus far.  He tolerated today's session with pain level remaining low.  He has met all goals and requests to d/c at this time.   OBJECTIVE IMPAIRMENTS: decreased activity tolerance, decreased ROM, decreased strength, hypomobility, increased muscle spasms, impaired flexibility, and pain.   ACTIVITY LIMITATIONS: carrying, bending, sitting, standing, squatting, sleeping, and bed mobility  PARTICIPATION LIMITATIONS: laundry, shopping, community  activity, and yard work  PERSONAL FACTORS: Behavior pattern, Past/current experiences, Time since onset of injury/illness/exacerbation, and 1-2 comorbidities: recent R TKA revision,HTN  are also affecting patient's functional outcome.   REHAB POTENTIAL: Fair    CLINICAL DECISION MAKING: Stable/uncomplicated  EVALUATION COMPLEXITY: Low   GOALS: Goals reviewed with patient? Yes  SHORT TERM GOALS: Target date: 2 weeks through 02/13/23  I HEP Baseline:initiated  Goal status: met - 04/02/23  LONG TERM GOALS: Target date: 04/24/23  Modified oswestry improve to 19/50 Baseline: 29/50 Goal status:Met - 10/50 -04/02/23  2.  Improve R hamstrings flexibility to -30 Baseline: -60 Goal status: MET -15 today  3.  Improve 30 sec sit to stand from  7 to12 Baseline: 7 Goal status: Met  04/02/23   PLAN:  PT FREQUENCY: 3x/week  PT DURATION: 12 weeks  PLANNED INTERVENTIONS: Therapeutic exercises, Therapeutic activity, Neuromuscular re-education, Balance training, Gait training, Patient/Family education, Self Care, and Joint mobilization.aquatic PT  Mayer Camel, PTA 04/02/23 9:40 AM [facility] [address] [place], [place], [address] Phone: [phone]   Fax:  [phone]

## 2023-04-04 ENCOUNTER — Ambulatory Visit (HOSPITAL_BASED_OUTPATIENT_CLINIC_OR_DEPARTMENT_OTHER): Payer: BC Managed Care – PPO | Admitting: Physical Therapy

## 2023-04-09 ENCOUNTER — Ambulatory Visit (HOSPITAL_BASED_OUTPATIENT_CLINIC_OR_DEPARTMENT_OTHER): Payer: BC Managed Care – PPO | Admitting: Physical Therapy

## 2023-06-21 ENCOUNTER — Other Ambulatory Visit: Payer: Self-pay | Admitting: Orthopedic Surgery

## 2023-06-21 DIAGNOSIS — M259 Joint disorder, unspecified: Secondary | ICD-10-CM

## 2023-07-03 ENCOUNTER — Ambulatory Visit
Admission: RE | Admit: 2023-07-03 | Discharge: 2023-07-03 | Disposition: A | Discharge status: Home / Self Care | Payer: Medicare Other | Source: Ambulatory Visit | Attending: Orthopedic Surgery | Admitting: Orthopedic Surgery

## 2023-07-03 DIAGNOSIS — M259 Joint disorder, unspecified: Secondary | ICD-10-CM

## 2023-09-20 ENCOUNTER — Other Ambulatory Visit: Payer: Self-pay

## 2023-09-20 ENCOUNTER — Ambulatory Visit: Payer: Medicare Other | Attending: Orthopedic Surgery

## 2023-09-20 DIAGNOSIS — R262 Difficulty in walking, not elsewhere classified: Secondary | ICD-10-CM | POA: Insufficient documentation

## 2023-09-20 DIAGNOSIS — Z7409 Other reduced mobility: Secondary | ICD-10-CM | POA: Insufficient documentation

## 2023-09-20 DIAGNOSIS — M533 Sacrococcygeal disorders, not elsewhere classified: Secondary | ICD-10-CM | POA: Insufficient documentation

## 2023-09-20 NOTE — Therapy (Signed)
 OUTPATIENT PHYSICAL THERAPY THORACOLUMBAR EVALUATION   Patient Name: HILL MACKIE MRN: [id number] DOB:11/12/1955, 68 y.o., male Today's Date: 09/20/2023  END OF SESSION:  PT End of Session - 09/20/23 0854     Visit Number 1    PT Start Time 0847    PT Stop Time 0925    PT Time Calculation (min) 38 min    Activity Tolerance Patient tolerated treatment well;No increased pain    Behavior During Therapy WFL for tasks assessed/performed             Past Medical History:  Diagnosis Date   Anemia    Arthritis    oa   Bruit of right carotid artery    Cataract    right eye   Elevated cholesterol    GERD (gastroesophageal reflux disease)    Hypertension    Plantar fascial fibromatosis    Past Surgical History:  Procedure Laterality Date   colonscopy  age 71   EYE SURGERY Left 04/10/2016   cataract surgery   EYE SURGERY Bilateral 2015   stents   knee scope and cartlidge implant Left 2009   TOTAL KNEE ARTHROPLASTY Right 05/23/2016   Procedure: RIGHT TOTAL KNEE ARTHROPLASTY;  Surgeon: Donnice Car, MD;  Location: [facility];  Service: Orthopedics;  Laterality: Right;  Adductor block   TOTAL KNEE REVISION Right 10/19/2022   Procedure: TOTAL KNEE REVISION;  Surgeon: Car Donnice, MD;  Location: [facility];  Service: Orthopedics;  Laterality: Right;   Patient Active Problem List   Diagnosis Date Noted   S/P revision of total knee, 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   Obese 05/24/2016   S/P right TKA 05/23/2016   S/P knee replacement 05/23/2016    PCP: Shirley Shown, MD  REFERRING PROVIDER: Donaciano Sprang, MD  REFERRING DIAG: chronic L SI jt pain  Rationale for Evaluation and Treatment: Rehabilitation  THERAPY DIAG:  Difficulty in walking, not elsewhere classified  Sacroiliac dysfunction  Impaired functional mobility and activity tolerance  ONSET DATE: 09/14/23  SUBJECTIVE:                                                                                                                                                                                            SUBJECTIVE STATEMENT: Pt reports he underwent L SI jt fusion last week.  Can't lie flat on his back due to the fusion.  Overall pain is better than prior to surgery, just the normal soreness expected from surgery  PERTINENT HISTORY:  Per pt and wife, Underwent L SI jt fusion, 2 screws, on 09/14/23  PAIN:  Are you having pain? Yes: NPRS scale: 3-5 Pain location:  L SI area Pain description: stiff, sore Aggravating factors: sitting or lying down in wrong position Relieving factors: moving, changing position  PRECAUTIONS: None  RED FLAGS: None   WEIGHT BEARING RESTRICTIONS:  unclear, using r walker  FALLS:  Has patient fallen in last 6 months? No  LIVING ENVIRONMENT: Lives with: lives with their spouse Lives in: House/apartment Stairs: Yes: Internal: flight steps; on right going up Has following equipment at home: Single point cane and Walker - 2 wheeled  OCCUPATION: retired   PLOF: I  PATIENT GOALS: be able to resume pool exercise  NEXT MD VISIT: 2 weeks  OBJECTIVE:  Note: Objective measures were completed at Evaluation unless otherwise noted.  DIAGNOSTIC FINDINGS:  na  PATIENT SURVEYS:  Modified Oswestry 32 / 50 = 64.0 %   COGNITION: Overall cognitive status: Within functional limits for tasks assessed     SENSATION: WFL  MUSCLE LENGTH: Hamstrings: Right wfl deg; Left wfl deg Debby test: Right na deg; Left na deg  POSTURE: using walker and maintains L hip flexed, L knee extended with all movements, in protective posture  PALPATION: na  LUMBAR ROM: NA as unclear on surgical precautions   LOWER EXTREMITY ROM:   B knees, ankles wfl, L hip NA due to his recent surgery  LOWER EXTREMITY MMT:    MMT Right eval Left eval  Hip flexion    Hip extension    Hip abduction    Hip adduction    Hip internal rotation    Hip external rotation    Knee flexion 5 5  Knee extension 5 5   Ankle dorsiflexion    Ankle plantarflexion    Ankle inversion    Ankle eversion     (Blank rows = not tested)    FUNCTIONAL TESTS:  Timed up and go (TUG): greater than 30 sec  GAIT:' Distance walked: in clinic 60' Assistive device utilized: Environmental Consultant - 2 wheeled Level of assistance: Modified independence Comments: L LE forward with knee extended, step to pattern , leans somewhat on walker   TREATMENT DATE: 09/20/23: Eval, discussed aquatic PT as getting back to it is one of pts goals.  Advised him that his surgeon most likely has specific timeline for submerging , until his incision is healed. Did instruct him in the following ex listed below to engage his LE musculature safely to retain some of his strength, position awareness                                                                                                                                  PATIENT EDUCATION:  Education details: POC, goals Person educated: Patient and Spouse Education method: Explanation, Demonstration, Tactile cues, Verbal cues, and Handouts Education comprehension: verbalized understanding and returned demonstration  HOME EXERCISE PROGRAM: Access Code: [email or web address] URL: [email or web address] Date: 09/20/2023 Prepared by: Greig Credit  Exercises - Seated Long Arc Quad  - 1 x daily - 7 x  weekly - 3 sets - 10 reps - Standing Heel Raise with Toes Turned Out  - 1 x daily - 7 x weekly - 3 sets - 10 reps - Standing Knee Flexion AROM with Chair Support  - 1 x daily - 7 x weekly - 3 sets - 10 reps - Standing Hip Extension with Counter Support  - 1 x daily - 7 x weekly - 3 sets - 10 reps  ASSESSMENT:  CLINICAL IMPRESSION: Patient is a 68 y.o. male who was evaluated  today by physical therapy for chronic L SI pain.  At the time of evaluation there was no documentation regarding his recent L SI fusion on Sep 14, 2023. Therefore we requested additional information from the surgeon regarding how to  proceed with his out patient recovery.  He is doing well with his mobility, able to perform modified transfers with the walker I and able to walk short distances.  We established a baseline safe home exercise program and are awaiting further word from surgeon before establishing a POC . His PT referral was dated prior to his surgery date as well.  OBJECTIVE IMPAIRMENTS: Abnormal gait, decreased activity tolerance, decreased mobility, difficulty walking, decreased ROM, hypomobility, and pain.   ACTIVITY LIMITATIONS: carrying, lifting, bending, sitting, standing, squatting, sleeping, bathing, dressing, and locomotion level  PARTICIPATION LIMITATIONS: meal prep, cleaning, laundry, driving, shopping, and community activity  PERSONAL FACTORS: Age, Behavior pattern, Education, Fitness, Past/current experiences, Time since onset of injury/illness/exacerbation, and 1-2 comorbidities: h/o LS fusion and R TKA revision in the last year  are also affecting patient's functional outcome.   REHAB POTENTIAL: Good  CLINICAL DECISION MAKING: Unstable/unpredictable  EVALUATION COMPLEXITY: High   GOALS: Goals reviewed with patient? Yes  SHORT TERM GOALS: Target date: 09/20/23  I HEP Baseline:established at eval and pt able to perform. Goal status: INITIAL   PLAN:  PT FREQUENCY: one time visit  PT DURATION:  1 sessions  PLANNED INTERVENTIONS: .  PLAN FOR NEXT SESSION: awaiting further instruction from surgeon regarding precautions, advancement of rehab for his SI fusion.   Jannis Atkins L Trisa Cranor, PT, DPT, OCS 09/20/2023, 12:54 PM

## 2023-11-21 ENCOUNTER — Ambulatory Visit: Attending: Orthopedic Surgery | Admitting: Physical Therapy

## 2023-11-21 ENCOUNTER — Other Ambulatory Visit: Payer: Self-pay

## 2023-11-21 DIAGNOSIS — M533 Sacrococcygeal disorders, not elsewhere classified: Secondary | ICD-10-CM | POA: Diagnosis present

## 2023-11-21 DIAGNOSIS — M5417 Radiculopathy, lumbosacral region: Secondary | ICD-10-CM | POA: Diagnosis present

## 2023-11-21 DIAGNOSIS — M6281 Muscle weakness (generalized): Secondary | ICD-10-CM | POA: Insufficient documentation

## 2023-11-21 DIAGNOSIS — R262 Difficulty in walking, not elsewhere classified: Secondary | ICD-10-CM | POA: Insufficient documentation

## 2023-11-21 DIAGNOSIS — M5431 Sciatica, right side: Secondary | ICD-10-CM | POA: Diagnosis present

## 2023-11-21 NOTE — Therapy (Signed)
 OUTPATIENT PHYSICAL THERAPY LOWER EXTREMITY EVALUATION   Patient Name: Micheal Lucas MRN: [id number] DOB:02-28-1956, 68 y.o., male Today's Date: 11/21/2023  END OF SESSION:  PT End of Session - 11/21/23 0848     Visit Number 1    Number of Visits 12    Date for PT Re-Evaluation 01/02/24    Authorization Type [employer]    PT Start Time 0848    PT Stop Time 0925    PT Time Calculation (min) 37 min    Activity Tolerance Patient tolerated treatment well;No increased pain    Behavior During Therapy WFL for tasks assessed/performed             Past Medical History:  Diagnosis Date   Anemia    Arthritis    oa   Bruit of right carotid artery    Cataract    right eye   Elevated cholesterol    GERD (gastroesophageal reflux disease)    Hypertension    Plantar fascial fibromatosis    Past Surgical History:  Procedure Laterality Date   colonscopy  age 89   EYE SURGERY Left 04/10/2016   cataract surgery   EYE SURGERY Bilateral 2015   stents   knee scope and cartlidge implant Left 2009   TOTAL KNEE ARTHROPLASTY Right 05/23/2016   Procedure: RIGHT TOTAL KNEE ARTHROPLASTY;  Surgeon: Durene Romans, MD;  Location: [facility];  Service: Orthopedics;  Laterality: Right;  Adductor block   TOTAL KNEE REVISION Right 10/19/2022   Procedure: TOTAL KNEE REVISION;  Surgeon: Durene Romans, MD;  Location: [facility];  Service: Orthopedics;  Laterality: Right;   Patient Active Problem List   Diagnosis Date Noted   S/P revision of total knee, 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   Obese 05/24/2016   S/P right TKA 05/23/2016   S/P knee replacement 05/23/2016    PCP: Montez Hageman, DO  REFERRING PROVIDER: Venita Lick, MD  REFERRING DIAG:   THERAPY DIAG:  Sciatica, right side  Muscle weakness (generalized)  Radiculopathy, lumbosacral region  Rationale for Evaluation and Treatment: Rehabilitation  ONSET DATE: 09/14/23  SUBJECTIVE:   SUBJECTIVE STATEMENT: Had a lower lumbar fusion. Left side pain  has improved. Pain is now more on the right side -- radiates down the leg. Pt states he has mild pain currently. Pt wants to get into aquatic therapy. Goes to [facility] and uses the heated pool. Has been doing exercises after the surgery that he was provided from prior episode of PT.  PERTINENT HISTORY: TKA May 2024, L SI joint fusion January 2025  PAIN:  Are you having pain? Yes: NPRS scale: at worst 10/10, at best 5/10 Pain location: R posterior hip and radiates down posterior leg Pain description: dull and sharp Aggravating factors: Sitting and standing (~5-10 min) Relieving factors: Laying flat  PRECAUTIONS: None  RED FLAGS: None   WEIGHT BEARING RESTRICTIONS: No  FALLS:  Has patient fallen in last 6 months? No  LIVING ENVIRONMENT: Lives with: lives with their spouse Lives in: House/apartment Stairs: Yes: External: 2 steps; none Has following equipment at home: None  OCCUPATION: Retired -- Quarry manager, wants to travel  PLOF: Independent  PATIENT GOALS: get back to traveling  NEXT MD VISIT: n/a  OBJECTIVE:  Note: Objective measures were completed at Evaluation unless otherwise noted.  DIAGNOSTIC FINDINGS: nothing new on Epic  PATIENT SURVEYS:  LEFS 49/80  COGNITION: Overall cognitive status: Within functional limits for tasks assessed     SENSATION: WFL  EDEMA:  None  MUSCLE LENGTH: Hamstrings:  Right tighter than left and reproduced his nerve pain  POSTURE: increased lumbar lordosis  PALPATION: TTP L glute and piriformis  LUMBAR ROM:   Active  A/PROM  eval  Flexion 90%  Extension 100%  Right lateral flexion 50%  Left lateral flexion 90%  Right rotation 90%  Left rotation 100%   (Blank rows = not tested)   LOWER EXTREMITY ROM:  Active ROM Right eval Left eval  Hip flexion    Hip extension    Hip abduction Limited compared to L   Hip adduction    Hip internal rotation    Hip external rotation    Knee flexion    Knee extension ~5-10 away from  full ext   Ankle dorsiflexion    Ankle plantarflexion    Ankle inversion    Ankle eversion     (Blank rows = not tested)  LOWER EXTREMITY MMT:  MMT Right eval Left eval  Hip flexion 5 5  Hip extension 3- 3+  Hip abduction 3- 3+  Hip adduction    Hip internal rotation    Hip external rotation    Knee flexion 3+ 3+  Knee extension 5 5  Ankle dorsiflexion    Ankle plantarflexion    Ankle inversion    Ankle eversion     (Blank rows = not tested)  LOWER EXTREMITY SPECIAL TESTS:  Hip special tests: Luisa Hart (FABER) test: negative and Thomas test: negative SLR test: (+) on R  FUNCTIONAL TESTS:  5 times sit to stand: TBA Double leg lowering test: TBA  GAIT: Distance walked: Into clinic Assistive device utilized: None Level of assistance: Complete Independence Comments: Normal reciprocal pattern                                                                                                                                TREATMENT DATE: 11/21/23 See HEP below  Manual therapy  Skilled assessment and palpation for TPDN  STM & TPR glute and piriformis  Trigger Point Dry Needling  Subsequent Treatment: Instructions provided previously at initial dry needling treatment.   Patient Verbal Consent Given: Yes Education Handout Provided: Previously Provided Muscles Treated: R glute and piriformis Electrical Stimulation Performed: No Treatment Response/Outcome: Decreased muscle tension     PATIENT EDUCATION:  Education details: Exam findings, POC, initial HEP Person educated: Patient Education method: Explanation, Demonstration, and Handouts Education comprehension: verbalized understanding, returned demonstration, and needs further education  HOME EXERCISE PROGRAM: Access Code: [email or web address] URL: [email or web address] Date: 11/21/2023 Prepared by: Vernon Prey April Kirstie Peri  Exercises - Seated Piriformis Stretch  - 1 x daily - 7 x weekly - 1 sets - 1 min  hold - Seated Piriformis Stretch  - 1 x daily - 7 x weekly - 1 sets - 1 min hold - Standing Glute Med Mobilization with Small Ball on Wall  - 1 x daily - 7 x weekly - 1 sets - 1 min hold - Seated  Sciatic Tensioner  - 1 x daily - 7 x weekly - 1 sets - 10 reps  ASSESSMENT:  CLINICAL IMPRESSION: Patient is a 68 y.o. M who was seen today for physical therapy evaluation and treatment for R LE radiating pain. Assessment significant for bilat hip muscle tightness (R tighter than L) especially in glutes and piriformis, LE posterior chain weakness (R weaker than L), and high sciatic nerve tension in R LE. S/s appear most consistent to sciatica due to hip muscle tightness and weakness affecting pt's ability to tolerate prolonged sitting and standing. Pt will greatly benefit from PT to improve on these deficits. Performed dry needling today to reduce pt's R posterior hip tension. Plans for aquatics.   OBJECTIVE IMPAIRMENTS: decreased activity tolerance, decreased balance, decreased coordination, decreased endurance, decreased mobility, difficulty walking, decreased ROM, decreased strength, hypomobility, increased fascial restrictions, increased muscle spasms, impaired flexibility, improper body mechanics, postural dysfunction, and pain.   ACTIVITY LIMITATIONS: sitting, standing, and squatting  PARTICIPATION LIMITATIONS: driving and community activity  PERSONAL FACTORS: Age, Fitness, Past/current experiences, and Time since onset of injury/illness/exacerbation are also affecting patient's functional outcome.   REHAB POTENTIAL: Good  CLINICAL DECISION MAKING: Evolving/moderate complexity  EVALUATION COMPLEXITY: Moderate   GOALS: Goals reviewed with patient? Yes  SHORT TERM GOALS: Target date: 12/12/2023  Pt will be ind with initial HEP Baseline: Goal status: INITIAL  2.  Pt will have no radiating symptoms when performing SLR test to demo reduced neural tension Baseline:  Goal status:  INITIAL    LONG TERM GOALS: Target date: 01/02/2024   Pt will be ind with management and progression of land and aquatics HEP Baseline:  Goal status: INITIAL  2.  Pt will have improved bilat LE strength to at least 4+/5 for increased standing tolerance and stability Baseline:  Goal status: INITIAL  3.  Pt will be able to tolerate standing >/=15 min without radiating symptoms Baseline:  Goal status: INITIAL  4.  Pt will have improved LEFS to >/=58/80 to demo MCID Baseline:  Goal status: INITIAL   PLAN:  PT FREQUENCY: 2x/week  PT DURATION: 6 weeks  PLANNED INTERVENTIONS: 97164- PT Re-evaluation, 97110-Therapeutic exercises, 97530- Therapeutic activity, 97112- Neuromuscular re-education, 97535- Self Care, 16109- Manual therapy, [phone]- Gait training, [phone]- Aquatic Therapy, [phone]- Electrical stimulation (unattended), [phone]- Ionotophoresis 4mg /ml Dexamethasone, Patient/Family education, Balance training, Stair training, Taping, Dry Needling, Joint mobilization, Spinal mobilization, Cryotherapy, and Moist heat  PLAN FOR NEXT SESSION: Assess response to HEP. Initiate glute and hamstring strengthening. Continue to stretch glutes/piriformis/hamstring and sciatic nerve. How was dry needling?    Neisha Hinger April Ma L Kirstie Larsen, PT 11/21/2023, 10:42 AM

## 2023-11-28 ENCOUNTER — Ambulatory Visit: Payer: Self-pay | Admitting: Physical Therapy

## 2023-11-28 DIAGNOSIS — M6281 Muscle weakness (generalized): Secondary | ICD-10-CM

## 2023-11-28 DIAGNOSIS — R262 Difficulty in walking, not elsewhere classified: Secondary | ICD-10-CM

## 2023-11-28 DIAGNOSIS — M5417 Radiculopathy, lumbosacral region: Secondary | ICD-10-CM

## 2023-11-28 DIAGNOSIS — M5431 Sciatica, right side: Secondary | ICD-10-CM | POA: Diagnosis not present

## 2023-11-28 NOTE — Therapy (Addendum)
 OUTPATIENT PHYSICAL THERAPY TREATMENT   Patient Name: Micheal Lucas MRN: [id number] DOB:[birth year], 68 y.o., male Today's Date: 11/28/2023  END OF SESSION:  PT End of Session - 11/28/23 0847     Visit Number 2    Number of Visits 12    Date for PT Re-Evaluation 01/02/24    Authorization Type BCBS    PT Start Time 0847    PT Stop Time 0925    PT Time Calculation (min) 38 min    Activity Tolerance Patient tolerated treatment well;No increased pain    Behavior During Therapy WFL for tasks assessed/performed             Past Medical History:  Diagnosis Date   Anemia    Arthritis    oa   Bruit of right carotid artery    Cataract    right eye   Elevated cholesterol    GERD (gastroesophageal reflux disease)    Hypertension    Plantar fascial fibromatosis    Past Surgical History:  Procedure Laterality Date   colonscopy  age 55   EYE SURGERY Left 04/10/2016   cataract surgery   EYE SURGERY Bilateral 2015   stents   knee scope and cartlidge implant Left 2009   TOTAL KNEE ARTHROPLASTY Right 05/23/2016   Procedure: RIGHT TOTAL KNEE ARTHROPLASTY;  Surgeon: Claiborne Crew, MD;  Location: [facility];  Service: Orthopedics;  Laterality: Right;  Adductor block   TOTAL KNEE REVISION Right 10/19/2022   Procedure: TOTAL KNEE REVISION;  Surgeon: Claiborne Crew, MD;  Location: [facility];  Service: Orthopedics;  Laterality: Right;   Patient Active Problem List   Diagnosis Date Noted   S/P revision of total knee, 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 Pt states that the hip is not too bad - feels better than it was.   PERTINENT HISTORY: TKA May 2024, L SI joint fusion January 2025  PAIN:  Are you having pain? Yes: NPRS scale: currently 4, at worst 10/10, at best 5/10 Pain location: R posterior hip and radiates down posterior leg Pain description: dull and sharp Aggravating factors: Sitting and standing (~5-10 min) Relieving factors: Laying flat  PRECAUTIONS: None  RED FLAGS: None   WEIGHT BEARING RESTRICTIONS: No  FALLS:  Has patient fallen in last 6 months? No  LIVING ENVIRONMENT: Lives with: lives with their spouse Lives in: House/apartment Stairs: Yes: External: 2 steps; none Has following equipment at home: None  OCCUPATION: Retired -- Quarry manager, wants to travel  PLOF: Independent  PATIENT GOALS: get back to traveling  NEXT MD VISIT: n/a  OBJECTIVE:  Note: Objective measures were completed at Evaluation unless otherwise noted.  DIAGNOSTIC FINDINGS: nothing new on Epic  PATIENT SURVEYS:  LEFS 49/80  COGNITION: Overall cognitive status: Within functional limits for tasks assessed     SENSATION: WFL  EDEMA:  None  MUSCLE LENGTH: Hamstrings: Right tighter than left and reproduced his nerve pain  POSTURE: increased lumbar lordosis  PALPATION: TTP L glute and piriformis  LUMBAR ROM:   Active  A/PROM  eval  Flexion 90%  Extension 100%  Right lateral flexion 50%  Left lateral flexion 90%  Right rotation 90%  Left rotation 100%   (Blank rows = not tested)   LOWER EXTREMITY ROM:  Active ROM Right eval Left eval  Hip flexion    Hip extension    Hip abduction Limited compared to L   Hip adduction    Hip internal rotation    Hip external rotation    Knee flexion    Knee extension ~5-10 away from full ext   Ankle dorsiflexion    Ankle plantarflexion    Ankle inversion    Ankle eversion     (Blank rows = not tested)  LOWER EXTREMITY  MMT:  MMT Right eval Left eval  Hip flexion 5 5  Hip extension 3- 3+  Hip abduction 3- 3+  Hip adduction    Hip internal rotation    Hip external rotation    Knee flexion 3+ 3+  Knee extension 5 5  Ankle dorsiflexion    Ankle plantarflexion    Ankle inversion    Ankle eversion     (Blank rows = not tested)  LOWER EXTREMITY SPECIAL TESTS:  Hip special tests: Portia Brittle (FABER) test: negative and Thomas test: negative SLR test: (+) on R  FUNCTIONAL TESTS:  5 times sit to stand: TBA Double leg lowering test: TBA  GAIT: Distance walked: Into clinic Assistive device utilized: None Level of assistance: Complete Independence Comments: Normal reciprocal pattern                                                                                                                                TREATMENT DATE:  11/28/23 Therapeutic exercise: Nustep L5 x 5 min Seated piriformis stretch x 30" Seated figure 4 stretch x 30" Seated hamstring stretch 2x 30" Supine ITB stretch with strap 2x 30"  Neuromuscular Re-ed: Sidelying clamshell yellow TB 2x10 Sidelying reverse clam yellow TB 2x10 Sidelying hip abd yellow TB 2x10 Sidelying hip ext yellow TB 2x10 Prone hamstring curl yellow TB 2x10 Standing palloff press yellow TB (doubled) x10  Manual therapy:  Skilled assessment and palpation for TPDN  STM & TPR glute and piriformis  Trigger Point Dry Needling  Subsequent Treatment: Instructions provided previously at initial dry needling treatment.   Patient Verbal Consent Given: Yes Education Handout Provided: Previously Provided Muscles Treated: R glute max, glute med and piriformis Electrical Stimulation Performed: No Treatment Response/Outcome: twitch response, Decreased muscle tension  11/21/23 See HEP below  Manual therapy  Skilled assessment and palpation for TPDN  STM & TPR glute and piriformis  Trigger Point Dry Needling  Subsequent Treatment: Instructions provided  previously at initial dry needling treatment.   Patient Verbal Consent Given: Yes Education Handout Provided: Previously Provided Muscles Treated: R glute and piriformis Electrical Stimulation Performed: No Treatment Response/Outcome: Decreased muscle tension     PATIENT EDUCATION:  Education details: Exam findings,  POC, initial HEP Person educated: Patient Education method: Explanation, Demonstration, and Handouts Education comprehension: verbalized understanding, returned demonstration, and needs further education  HOME EXERCISE PROGRAM: Access Code: [email or web address] URL: [email or web address] Date: 11/21/2023 Prepared by: Vernon Prey April Kirstie Peri  Exercises - Seated Piriformis Stretch  - 1 x daily - 7 x weekly - 1 sets - 1 min hold - Seated Piriformis Stretch  - 1 x daily - 7 x weekly - 1 sets - 1 min hold - Standing Glute Med Mobilization with Small Ball on Wall  - 1 x daily - 7 x weekly - 1 sets - 1 min hold - Seated Sciatic Tensioner  - 1 x daily - 7 x weekly - 1 sets - 10 reps  ASSESSMENT:  CLINICAL IMPRESSION: Treatment focused on continued stretching and dry needling to decrease trigger points in glutes and piriformis to reduce tension on pt's sciatic nerve. Initiated strengthening regimen for pt's hips and hamstrings to improve LE stability. Cues to keep from rotating in L-spine. Worked on core strengthening in standing to improve lumbopelvic stability.   From eval: Patient is a 68 y.o. M who was seen today for physical therapy evaluation and treatment for R LE radiating pain. Assessment significant for bilat hip muscle tightness (R tighter than L) especially in glutes and piriformis, LE posterior chain weakness (R weaker than L), and high sciatic nerve tension in R LE. S/s appear most consistent to sciatica due to hip muscle tightness and weakness affecting pt's ability to tolerate prolonged sitting and standing. Pt will greatly benefit from PT to improve on these  deficits. Performed dry needling today to reduce pt's R posterior hip tension. Plans for aquatics.   OBJECTIVE IMPAIRMENTS: decreased activity tolerance, decreased balance, decreased coordination, decreased endurance, decreased mobility, difficulty walking, decreased ROM, decreased strength, hypomobility, increased fascial restrictions, increased muscle spasms, impaired flexibility, improper body mechanics, postural dysfunction, and pain.   ACTIVITY LIMITATIONS: sitting, standing, and squatting  PARTICIPATION LIMITATIONS: driving and community activity  PERSONAL FACTORS: Age, Fitness, Past/current experiences, and Time since onset of injury/illness/exacerbation are also affecting patient's functional outcome.   REHAB POTENTIAL: Good  CLINICAL DECISION MAKING: Evolving/moderate complexity  EVALUATION COMPLEXITY: Moderate   GOALS: Goals reviewed with patient? Yes  SHORT TERM GOALS: Target date: 12/12/2023  Pt will be ind with initial HEP Baseline: Goal status: INITIAL  2.  Pt will have no radiating symptoms when performing SLR test to demo reduced neural tension Baseline:  Goal status: INITIAL    LONG TERM GOALS: Target date: 01/02/2024   Pt will be ind with management and progression of land and aquatics HEP Baseline:  Goal status: INITIAL  2.  Pt will have improved bilat LE strength to at least 4+/5 for increased standing tolerance and stability Baseline:  Goal status: INITIAL  3.  Pt will be able to tolerate standing >/=15 min without radiating symptoms Baseline:  Goal status: INITIAL  4.  Pt will have improved LEFS to >/=58/80 to demo MCID Baseline:  Goal status: INITIAL   PLAN:  PT FREQUENCY: 2x/week  PT DURATION: 6 weeks  PLANNED INTERVENTIONS: 97164- PT Re-evaluation, 97110-Therapeutic exercises, 97530- Therapeutic activity, 97112- Neuromuscular re-education, 97535- Self Care, 16109- Manual therapy, [phone]- Gait training, [phone]- Aquatic Therapy, [phone]-  Electrical stimulation (unattended), [phone]- Ionotophoresis 4mg /ml Dexamethasone, Patient/Family education, Balance training, Stair training, Taping, Dry Needling, Joint mobilization, Spinal mobilization, Cryotherapy, and Moist heat  PLAN FOR NEXT SESSION: Assess response to HEP. Initiate glute and hamstring strengthening. Continue to stretch glutes/piriformis/hamstring and sciatic  nerve. Needling/manual work    Asra Gambrel April [place], PT 11/28/2023, 8:47 AM

## 2023-12-04 ENCOUNTER — Ambulatory Visit

## 2023-12-04 DIAGNOSIS — R262 Difficulty in walking, not elsewhere classified: Secondary | ICD-10-CM

## 2023-12-04 DIAGNOSIS — M6281 Muscle weakness (generalized): Secondary | ICD-10-CM

## 2023-12-04 DIAGNOSIS — M5417 Radiculopathy, lumbosacral region: Secondary | ICD-10-CM

## 2023-12-04 DIAGNOSIS — M533 Sacrococcygeal disorders, not elsewhere classified: Secondary | ICD-10-CM

## 2023-12-04 DIAGNOSIS — M5431 Sciatica, right side: Secondary | ICD-10-CM

## 2023-12-04 NOTE — Therapy (Signed)
 OUTPATIENT PHYSICAL THERAPY TREATMENT   Patient Name: Micheal Lucas MRN: [id number] DOB:Jun 07, 1956, 68 y.o., male Today's Date: 12/04/2023  END OF SESSION:  PT End of Session - 12/04/23 0842     Visit Number 3    Number of Visits 12    Date for PT Re-Evaluation 01/02/24    Authorization Type BCBS    PT Start Time 0841    PT Stop Time 0928    PT Time Calculation (min) 47 min    Activity Tolerance Patient tolerated treatment well;No increased pain    Behavior During Therapy WFL for tasks assessed/performed              Past Medical History:  Diagnosis Date   Anemia    Arthritis    oa   Bruit of right carotid artery    Cataract    right eye   Elevated cholesterol    GERD (gastroesophageal reflux disease)    Hypertension    Plantar fascial fibromatosis    Past Surgical History:  Procedure Laterality Date   colonscopy  age 36   EYE SURGERY Left 04/10/2016   cataract surgery   EYE SURGERY Bilateral 2015   stents   knee scope and cartlidge implant Left 2009   TOTAL KNEE ARTHROPLASTY Right 05/23/2016   Procedure: RIGHT TOTAL KNEE ARTHROPLASTY;  Surgeon: Claiborne Crew, MD;  Location: [facility];  Service: Orthopedics;  Laterality: Right;  Adductor block   TOTAL KNEE REVISION Right 10/19/2022   Procedure: TOTAL KNEE REVISION;  Surgeon: Claiborne Crew, MD;  Location: [facility];  Service: Orthopedics;  Laterality: Right;   Patient Active Problem List   Diagnosis Date Noted   S/P revision of total knee, 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 Yes: NPRS scale: 5/10 Pain location: R posterior hip and radiates down posterior leg Pain description: dull and sharp Aggravating factors: Sitting and standing (~5-10 min) Relieving factors: Laying flat  PRECAUTIONS: None  RED FLAGS: None   WEIGHT BEARING RESTRICTIONS: No  FALLS:  Has patient fallen in last 6 months? No  LIVING ENVIRONMENT: Lives with: lives with their spouse Lives in: House/apartment Stairs: Yes: External: 2 steps; none Has following equipment at home: None  OCCUPATION: Retired -- Quarry manager, wants to travel  PLOF: Independent  PATIENT GOALS: get back to traveling  NEXT MD VISIT: n/a  OBJECTIVE:  Note: Objective measures were completed at Evaluation unless otherwise noted.  DIAGNOSTIC FINDINGS: nothing new on Epic  PATIENT SURVEYS:  LEFS 49/80  COGNITION: Overall cognitive status: Within functional limits for tasks assessed     SENSATION: WFL  EDEMA:  None  MUSCLE LENGTH: Hamstrings: Right tighter than left and reproduced his nerve pain  POSTURE: increased lumbar lordosis  PALPATION: TTP L glute and piriformis  LUMBAR ROM:   Active  A/PROM  eval  Flexion 90%  Extension 100%  Right lateral flexion 50%  Left lateral flexion 90%  Right  rotation 90%  Left rotation 100%   (Blank rows = not tested)   LOWER EXTREMITY ROM:  Active ROM Right eval Left eval  Hip flexion    Hip extension    Hip abduction Limited compared to L   Hip adduction    Hip internal rotation    Hip external rotation    Knee flexion    Knee extension ~5-10 away from full ext   Ankle dorsiflexion    Ankle plantarflexion    Ankle inversion    Ankle eversion     (Blank rows = not tested)  LOWER EXTREMITY MMT:  MMT Right eval Left eval  Hip flexion 5 5  Hip extension 3- 3+  Hip  abduction 3- 3+  Hip adduction    Hip internal rotation    Hip external rotation    Knee flexion 3+ 3+  Knee extension 5 5  Ankle dorsiflexion    Ankle plantarflexion    Ankle inversion    Ankle eversion     (Blank rows = not tested)  LOWER EXTREMITY SPECIAL TESTS:  Hip special tests: Portia Brittle (FABER) test: negative and Thomas test: negative SLR test: (+) on R  FUNCTIONAL TESTS:  5 times sit to stand: TBA Double leg lowering test: TBA  GAIT: Distance walked: Into clinic Assistive device utilized: None Level of assistance: Complete Independence Comments: Normal reciprocal pattern                                                                                                                                TREATMENT DATE:  12/04/23 Therapeutic exercise: Nustep L5 x 5 min Supine HS stretch w/ strap 2x30" bil Supine LTR 5x10" bil Seated piriformis stretch x 30" bil Seated figure 4 stretch x 30" bil Standing lumbar ext 10x3" Prone press up x 10  Manual therapy: STM to L mid hamstrings 11/28/23 Therapeutic exercise: Nustep L5 x 5 min Seated piriformis stretch x 30" Seated figure 4 stretch x 30" Seated hamstring stretch 2x 30" Supine ITB stretch with strap 2x 30"  Neuromuscular Re-ed: Sidelying clamshell yellow TB 2x10 Sidelying reverse clam yellow TB 2x10 Sidelying hip abd yellow TB 2x10 Sidelying hip ext yellow TB 2x10 Prone hamstring curl yellow TB 2x10 Standing palloff press yellow TB (doubled) x10  Manual therapy:  Skilled assessment and palpation for TPDN  STM & TPR glute and piriformis  Trigger Point Dry Needling  Subsequent Treatment: Instructions provided previously at initial dry needling treatment.   Patient Verbal Consent Given: Yes Education Handout Provided: Previously Provided Muscles Treated: R glute max, glute med and piriformis Electrical Stimulation Performed: No Treatment Response/Outcome: twitch response, Decreased muscle  tension  11/21/23 See HEP below  Manual therapy  Skilled assessment and palpation for TPDN  STM & TPR glute and piriformis  Trigger Point Dry Needling  Subsequent Treatment: Instructions provided previously at initial dry needling treatment.   Patient Verbal Consent Given: Yes Education Handout Provided:  Previously Provided Muscles Treated: R glute and piriformis Electrical Stimulation Performed: No Treatment Response/Outcome: Decreased muscle tension     PATIENT EDUCATION:  Education details: Exam findings, POC, initial HEP Person educated: Patient Education method: Explanation, Demonstration, and Handouts Education comprehension: verbalized understanding, returned demonstration, and needs further education  HOME EXERCISE PROGRAM: Access Code: [email or web address] URL: [email or web address] Date: 11/21/2023 Prepared by: Gellen April Erman [place]  Exercises - Seated Piriformis Stretch  - 1 x daily - 7 x weekly - 1 sets - 1 min hold - Seated Piriformis Stretch  - 1 x daily - 7 x weekly - 1 sets - 1 min hold - Standing Glute Med Mobilization with Small Ball on Wall  - 1 x daily - 7 x weekly - 1 sets - 1 min hold - Seated Sciatic Tensioner  - 1 x daily - 7 x weekly - 1 sets - 10 reps  ASSESSMENT:  CLINICAL IMPRESSION: Continued working on improve patient's flexibility and posterior chain strength. R hip extension causes pain while in prone position, may need to work on [employer] to work on [employer]. Good tolerance for the treatment today, also added HS stretch for HEP.  From eval: Patient is a 68 y.o. M who was seen today for physical therapy evaluation and treatment for R LE radiating pain. Assessment significant for bilat hip muscle tightness (R tighter than L) especially in glutes and piriformis, LE posterior chain weakness (R weaker than L), and high sciatic nerve tension in R LE. S/s appear most consistent to sciatica due to hip muscle tightness and weakness  affecting pt's ability to tolerate prolonged sitting and standing. Pt will greatly benefit from PT to improve on these deficits. Performed dry needling today to reduce pt's R posterior hip tension. Plans for aquatics.   OBJECTIVE IMPAIRMENTS: decreased activity tolerance, decreased balance, decreased coordination, decreased endurance, decreased mobility, difficulty walking, decreased ROM, decreased strength, hypomobility, increased fascial restrictions, increased muscle spasms, impaired flexibility, improper body mechanics, postural dysfunction, and pain.   ACTIVITY LIMITATIONS: sitting, standing, and squatting  PARTICIPATION LIMITATIONS: driving and community activity  PERSONAL FACTORS: Age, Fitness, Past/current experiences, and Time since onset of injury/illness/exacerbation are also affecting patient's functional outcome.   REHAB POTENTIAL: Good  CLINICAL DECISION MAKING: Evolving/moderate complexity  EVALUATION COMPLEXITY: Moderate   GOALS: Goals reviewed with patient? Yes  SHORT TERM GOALS: Target date: 12/12/2023  Pt will be ind with initial HEP Baseline: Goal status: INITIAL  2.  Pt will have no radiating symptoms when performing SLR test to demo reduced neural tension Baseline:  Goal status: INITIAL    LONG TERM GOALS: Target date: 01/02/2024   Pt will be ind with management and progression of land and aquatics HEP Baseline:  Goal status: INITIAL  2.  Pt will have improved bilat LE strength to at least 4+/5 for increased standing tolerance and stability Baseline:  Goal status: INITIAL  3.  Pt will be able to tolerate standing >/=15 min without radiating symptoms Baseline:  Goal status: INITIAL  4.  Pt will have improved LEFS to >/=58/80 to demo MCID Baseline:  Goal status: INITIAL   PLAN:  PT FREQUENCY: 2x/week  PT DURATION: 6 weeks  PLANNED INTERVENTIONS: 97164- PT Re-evaluation, 97110-Therapeutic exercises, 97530- Therapeutic activity, 97112-  Neuromuscular re-education, 97535- Self Care, 98119- Manual therapy, [phone]- Gait training, [phone]- Aquatic Therapy, [phone]- Electrical stimulation (unattended), [phone]- Ionotophoresis 4mg /ml Dexamethasone , Patient/Family education, Balance training, Stair training, Taping, Dry Needling, Joint mobilization, Spinal mobilization, Cryotherapy, and Moist heat  PLAN  FOR NEXT SESSION: Assess response to HEP. Initiate glute and hamstring strengthening. Continue to stretch glutes/piriformis/hamstring and sciatic nerve. Needling/manual work    Samuella Crocker, PTA 12/04/2023, 10:03 AM

## 2023-12-07 ENCOUNTER — Ambulatory Visit (HOSPITAL_BASED_OUTPATIENT_CLINIC_OR_DEPARTMENT_OTHER): Attending: Orthopedic Surgery | Admitting: Physical Therapy

## 2023-12-07 DIAGNOSIS — M5431 Sciatica, right side: Secondary | ICD-10-CM | POA: Insufficient documentation

## 2023-12-07 DIAGNOSIS — M5417 Radiculopathy, lumbosacral region: Secondary | ICD-10-CM | POA: Diagnosis present

## 2023-12-07 DIAGNOSIS — M6281 Muscle weakness (generalized): Secondary | ICD-10-CM | POA: Insufficient documentation

## 2023-12-07 DIAGNOSIS — R262 Difficulty in walking, not elsewhere classified: Secondary | ICD-10-CM | POA: Insufficient documentation

## 2023-12-07 NOTE — Therapy (Signed)
 OUTPATIENT PHYSICAL THERAPY TREATMENT   Patient Name: Micheal Lucas MRN: [id number] DOB:[birth year], 68 y.o., male Today's Date: 12/07/2023  END OF SESSION:  PT End of Session - 12/07/23 0916     Visit Number 4    Number of Visits 12    Date for PT Re-Evaluation 01/02/24    Authorization Type BCBS    PT Start Time 0825    PT Stop Time 0918    PT Time Calculation (min) 53 min    Activity Tolerance Patient tolerated treatment well;No increased pain    Behavior During Therapy WFL for tasks assessed/performed               Past Medical History:  Diagnosis Date   Anemia    Arthritis    oa   Bruit of right carotid artery    Cataract    right eye   Elevated cholesterol    GERD (gastroesophageal reflux disease)    Hypertension    Plantar fascial fibromatosis    Past Surgical History:  Procedure Laterality Date   colonscopy  age 22   EYE SURGERY Left 04/10/2016   cataract surgery   EYE SURGERY Bilateral 2015   stents   knee scope and cartlidge implant Left 2009   TOTAL KNEE ARTHROPLASTY Right 05/23/2016   Procedure: RIGHT TOTAL KNEE ARTHROPLASTY;  Surgeon: Claiborne Crew, MD;  Location: [facility];  Service: Orthopedics;  Laterality: Right;  Adductor block   TOTAL KNEE REVISION Right 10/19/2022   Procedure: TOTAL KNEE REVISION;  Surgeon: Claiborne Crew, MD;  Location: [facility];  Service: Orthopedics;  Laterality: Right;   Patient Active Problem List   Diagnosis Date Noted   S/P revision of total knee, 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  "I couldn't get my leg up when on stomach. It was terrible".  Pt reports that he does his exercises in pool 3x/wk (HEP from previous episode)  PERTINENT HISTORY: TKA May 2024, L SI joint fusion January 2025  PAIN:  Are you having pain? Yes: NPRS scale: 4-6/10 Pain location: R posterior hip and radiates down posterior leg to calf Pain description: dull and sharp Aggravating factors: Sitting and standing (~5-10 min) Relieving factors: Laying flat  PRECAUTIONS: None  RED FLAGS: None   WEIGHT BEARING RESTRICTIONS: No  FALLS:  Has patient fallen in last 6 months? No  LIVING ENVIRONMENT: Lives with: lives with their spouse Lives in: House/apartment Stairs: Yes: External: 2 steps; none Has following equipment at home: None  OCCUPATION: Retired -- Quarry manager, wants to travel  PLOF: Independent  PATIENT GOALS: get back to traveling  NEXT MD VISIT: n/a  OBJECTIVE:  Note: Objective measures were completed at Evaluation unless otherwise noted.  DIAGNOSTIC FINDINGS: nothing new on Epic  PATIENT SURVEYS:  LEFS 49/80  COGNITION: Overall cognitive status: Within functional limits for tasks assessed     SENSATION: WFL  EDEMA:  None  MUSCLE LENGTH: Hamstrings: Right tighter than left and reproduced his nerve pain  POSTURE: increased lumbar lordosis  PALPATION:  TTP L glute and piriformis  LUMBAR ROM:   Active  A/PROM  eval  Flexion 90%  Extension 100%  Right lateral flexion 50%  Left lateral flexion 90%  Right rotation 90%  Left rotation 100%   (Blank rows = not tested)   LOWER EXTREMITY ROM:  Active ROM Right eval Left eval  Hip flexion    Hip extension    Hip abduction Limited compared to L   Hip adduction    Hip internal rotation    Hip external rotation    Knee flexion    Knee extension ~5-10 away from full ext   Ankle dorsiflexion    Ankle plantarflexion    Ankle inversion    Ankle  eversion     (Blank rows = not tested)  LOWER EXTREMITY MMT:  MMT Right eval Left eval  Hip flexion 5 5  Hip extension 3- 3+  Hip abduction 3- 3+  Hip adduction    Hip internal rotation    Hip external rotation    Knee flexion 3+ 3+  Knee extension 5 5  Ankle dorsiflexion    Ankle plantarflexion    Ankle inversion    Ankle eversion     (Blank rows = not tested)  LOWER EXTREMITY SPECIAL TESTS:  Hip special tests: Portia Brittle (FABER) test: negative and Thomas test: negative SLR test: (+) on R  FUNCTIONAL TESTS:  5 times sit to stand: TBA Double leg lowering test: TBA  GAIT: Distance walked: Into clinic Assistive device utilized: None Level of assistance: Complete Independence Comments: Normal reciprocal pattern                                                                                                                                TREATMENT DATE:  Pt seen for aquatic therapy today.  Treatment took place in water  3.5-4.75 ft in depth at the [facility] pool. Temp of water  was 91.  Pt entered/exited the pool via stairs independently with bilat rail.   *walking forward/ backward multiple laps  * side stepping into wide squat with arm addct with blue hand floats x 3 laps * UE on blue hand floats:  single leg clam 2 x 10 each LE * 3 way LE stretch with hollow noodle,  * quad <> hip flexor stretch with foot on solid-> yellow noodle  * suitcase carry with single blue hand float - walking forward/ backward * UE on blue hand floats:  3 way LE kick ,2 x 10, cues to only touch toe to back, improved tolerance  * calf stretch -> heels off bottom ladder hole x 20s;  BKTC stretch with feet in bottom ladder hole, and UE on rails (good tolerance) * TrA set with noodle-> yellow hand float pull downs to thighs in wide and staggered stance  * plank on blue hand floats -> superman to/from plank -> alternating leg lifts (very small range)   12/04/23 Therapeutic  exercise: Nustep L5 x 5 min Supine HS stretch w/ strap 2x30" bil Supine LTR 5x10" bil Seated piriformis stretch x 30" bil Seated figure 4 stretch x 30" bil Standing lumbar ext 10x3" Prone press up x 10  Manual therapy: STM to L mid hamstrings 11/28/23 Therapeutic exercise: Nustep L5 x 5 min Seated piriformis stretch x 30" Seated figure 4 stretch x 30" Seated hamstring stretch 2x 30" Supine ITB stretch with strap 2x 30"  Neuromuscular Re-ed: Sidelying clamshell yellow TB 2x10 Sidelying reverse clam yellow TB 2x10 Sidelying hip abd yellow TB 2x10 Sidelying hip ext yellow TB 2x10 Prone hamstring curl yellow TB 2x10 Standing palloff press yellow TB (doubled) x10  Manual therapy:  Skilled assessment and palpation for TPDN  STM & TPR glute and piriformis  Trigger Point Dry Needling  Subsequent Treatment: Instructions provided previously at initial dry needling treatment.   Patient Verbal Consent Given: Yes Education Handout Provided: Previously Provided Muscles Treated: R glute max, glute med and piriformis Electrical Stimulation Performed: No Treatment Response/Outcome: twitch response, Decreased muscle tension  11/21/23 See HEP below  Manual therapy  Skilled assessment and palpation for TPDN  STM & TPR glute and piriformis  Trigger Point Dry Needling  Subsequent Treatment: Instructions provided previously at initial dry needling treatment.   Patient Verbal Consent Given: Yes Education Handout Provided: Previously Provided Muscles Treated: R glute and piriformis Electrical Stimulation Performed: No Treatment Response/Outcome: Decreased muscle tension     PATIENT EDUCATION:  Education details: reacquainting to aquatic therapy  Person educated: Patient Education method: Programmer, multimedia, Demonstration Education comprehension: verbalized understanding, returned demonstration, and needs further education  HOME EXERCISE PROGRAM: Access Code: [email or web address] URL:  [email or web address] Date: 11/21/2023 Prepared by: Gellen April Erman Hayward  Exercises - Seated Piriformis Stretch  - 1 x daily - 7 x weekly - 1 sets - 1 min hold - Seated Piriformis Stretch  - 1 x daily - 7 x weekly - 1 sets - 1 min hold - Standing Glute Med Mobilization with Small Ball on Wall  - 1 x daily - 7 x weekly - 1 sets - 1 min hold - Seated Sciatic Tensioner  - 1 x daily - 7 x weekly - 1 sets - 10 reps  ASSESSMENT:  CLINICAL IMPRESSION: Reviewed HEP exercises with pt from previous episode, discussing which exercises to eliminate  and which exercises to modify.  Updated HEP and pt to trial new modifications next time he is at [facility]. Pt reported reduction of pain to 2-3/10.  Goals are ongoing.   From eval: Patient is a 69 y.o. M who was seen today for physical therapy evaluation and treatment for R LE radiating pain. Assessment significant for bilat hip muscle tightness (R tighter than L) especially in glutes and piriformis, LE posterior chain weakness (R weaker than L), and high sciatic nerve tension in R LE. S/s appear most consistent to sciatica due to hip muscle tightness and weakness affecting pt's ability to tolerate prolonged sitting and standing. Pt will greatly benefit from PT to improve on these deficits. Performed dry needling today to reduce pt's R posterior hip tension. Plans for aquatics.   OBJECTIVE IMPAIRMENTS: decreased activity tolerance, decreased balance, decreased coordination, decreased endurance, decreased mobility, difficulty walking, decreased ROM, decreased strength, hypomobility, increased fascial restrictions, increased muscle spasms, impaired flexibility, improper body mechanics, postural dysfunction, and pain.   ACTIVITY LIMITATIONS: sitting, standing, and squatting  PARTICIPATION LIMITATIONS: driving and community activity  PERSONAL FACTORS: Age, Fitness, Past/current experiences, and Time since onset  of injury/illness/exacerbation are  also affecting patient's functional outcome.   REHAB POTENTIAL: Good  CLINICAL DECISION MAKING: Evolving/moderate complexity  EVALUATION COMPLEXITY: Moderate   GOALS: Goals reviewed with patient? Yes  SHORT TERM GOALS: Target date: 12/12/2023  Pt will be ind with initial HEP Baseline: Goal status: INITIAL  2.  Pt will have no radiating symptoms when performing SLR test to demo reduced neural tension Baseline:  Goal status: INITIAL    LONG TERM GOALS: Target date: 01/02/2024   Pt will be ind with management and progression of land and aquatics HEP Baseline:  Goal status: INITIAL  2.  Pt will have improved bilat LE strength to at least 4+/5 for increased standing tolerance and stability Baseline:  Goal status: INITIAL  3.  Pt will be able to tolerate standing >/=15 min without radiating symptoms Baseline:  Goal status: INITIAL  4.  Pt will have improved LEFS to >/=58/80 to demo MCID Baseline:  Goal status: INITIAL   PLAN:  PT FREQUENCY: 2x/week  PT DURATION: 6 weeks  PLANNED INTERVENTIONS: 97164- PT Re-evaluation, 97110-Therapeutic exercises, 97530- Therapeutic activity, 97112- Neuromuscular re-education, 97535- Self Care, 96295- Manual therapy, [phone]- Gait training, [phone]- Aquatic Therapy, [phone]- Electrical stimulation (unattended), [phone]- Ionotophoresis 4mg /ml Dexamethasone , Patient/Family education, Balance training, Stair training, Taping, Dry Needling, Joint mobilization, Spinal mobilization, Cryotherapy, and Moist heat  PLAN FOR NEXT SESSION: Assess response to HEP. Initiate glute and hamstring strengthening. Continue to stretch glutes/piriformis/hamstring and sciatic nerve. Needling/manual work   Almedia Lucas, [place] 12/07/23 9:30 AM [facility] [address] [place], [place], [address] Phone: [phone]   Fax:  [phone]

## 2023-12-11 ENCOUNTER — Ambulatory Visit

## 2023-12-11 DIAGNOSIS — M5431 Sciatica, right side: Secondary | ICD-10-CM

## 2023-12-11 DIAGNOSIS — M5417 Radiculopathy, lumbosacral region: Secondary | ICD-10-CM

## 2023-12-11 DIAGNOSIS — M6281 Muscle weakness (generalized): Secondary | ICD-10-CM

## 2023-12-11 NOTE — Therapy (Signed)
 OUTPATIENT PHYSICAL THERAPY TREATMENT   Patient Name: Micheal Lucas MRN: [id number] DOB:August 13, 1956, 68 y.o., male Today's Date: 12/11/2023  END OF SESSION:  PT End of Session - 12/11/23 1148     Visit Number 5    Number of Visits 12    Date for PT Re-Evaluation 01/02/24    Authorization Type BCBS    PT Start Time 1102    PT Stop Time 1148    PT Time Calculation (min) 46 min    Activity Tolerance Patient tolerated treatment well;No increased pain    Behavior During Therapy WFL for tasks assessed/performed                Past Medical History:  Diagnosis Date   Anemia    Arthritis    oa   Bruit of right carotid artery    Cataract    right eye   Elevated cholesterol    GERD (gastroesophageal reflux disease)    Hypertension    Plantar fascial fibromatosis    Past Surgical History:  Procedure Laterality Date   colonscopy  age 44   EYE SURGERY Left 04/10/2016   cataract surgery   EYE SURGERY Bilateral 2015   stents   knee scope and cartlidge implant Left 2009   TOTAL KNEE ARTHROPLASTY Right 05/23/2016   Procedure: RIGHT TOTAL KNEE ARTHROPLASTY;  Surgeon: Claiborne Crew, MD;  Location: [facility];  Service: Orthopedics;  Laterality: Right;  Adductor block   TOTAL KNEE REVISION Right 10/19/2022   Procedure: TOTAL KNEE REVISION;  Surgeon: Claiborne Crew, MD;  Location: [facility];  Service: Orthopedics;  Laterality: Right;   Patient Active Problem List   Diagnosis Date Noted   S/P revision of total knee, 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 Yes: NPRS scale: 4-6/10 Pain location: R posterior hip and radiates down posterior leg to calf Pain description: dull and sharp Aggravating factors: Sitting and standing (~5-10 min) Relieving factors: Laying flat  PRECAUTIONS: None  RED FLAGS: None   WEIGHT BEARING RESTRICTIONS: No  FALLS:  Has patient fallen in last 6 months? No  LIVING ENVIRONMENT: Lives with: lives with their spouse Lives in: House/apartment Stairs: Yes: External: 2 steps; none Has following equipment at home: None  OCCUPATION: Retired -- Quarry manager, wants to travel  PLOF: Independent  PATIENT GOALS: get back to traveling  NEXT MD VISIT: n/a  OBJECTIVE:  Note: Objective measures were completed at Evaluation unless otherwise noted.  DIAGNOSTIC FINDINGS: nothing new on Epic  PATIENT SURVEYS:  LEFS 49/80  COGNITION: Overall cognitive status: Within functional limits for tasks assessed     SENSATION: WFL  EDEMA:  None  MUSCLE LENGTH: Hamstrings: Right tighter than left and reproduced his nerve pain  POSTURE: increased lumbar lordosis  PALPATION: TTP L glute and piriformis  LUMBAR ROM:   Active  A/PROM  eval  Flexion 90%  Extension 100%  Right lateral flexion 50%  Left lateral flexion 90%  Right  rotation 90%  Left rotation 100%   (Blank rows = not tested)   LOWER EXTREMITY ROM:  Active ROM Right eval Left eval  Hip flexion    Hip extension    Hip abduction Limited compared to L   Hip adduction    Hip internal rotation    Hip external rotation    Knee flexion    Knee extension ~5-10 away from full ext   Ankle dorsiflexion    Ankle plantarflexion    Ankle inversion    Ankle eversion     (Blank rows = not tested)  LOWER EXTREMITY MMT:  MMT Right eval Left eval  Hip flexion 5 5  Hip extension 3- 3+  Hip abduction 3- 3+  Hip adduction    Hip internal  rotation    Hip external rotation    Knee flexion 3+ 3+  Knee extension 5 5  Ankle dorsiflexion    Ankle plantarflexion    Ankle inversion    Ankle eversion     (Blank rows = not tested)  LOWER EXTREMITY SPECIAL TESTS:  Hip special tests: Portia Brittle (FABER) test: negative and Thomas test: negative SLR test: (+) on R  FUNCTIONAL TESTS:  5 times sit to stand: TBA Double leg lowering test: TBA  GAIT: Distance walked: Into clinic Assistive device utilized: None Level of assistance: Complete Independence Comments: Normal reciprocal pattern                                                                                                                                TREATMENT DATE:  12/11/23 Therapeutic exercise: Nustep L5 x 5 min Supine KTOS piriformis stretch x 30" bil Supine SKTC stretch x 30" bil S/L clamshell x 10 bil S/L hip abduction x 10 bil Seated KTOS and figure 4 stretch x 30" bil  NMR: Standing shoulder extension RTB x 10  Pallof press RTB x 10 bil  Manual Therapy: STM to bil glutes, R gastroc  Pt seen for aquatic therapy today.  Treatment took place in water  3.5-4.75 ft in depth at the [facility] pool. Temp of water  was 91.  Pt entered/exited the pool via stairs independently with bilat rail.   *walking forward/ backward multiple laps  * side stepping into wide squat with arm addct with blue hand floats x 3 laps * UE on blue hand floats:  single leg clam 2 x 10 each LE * 3 way LE stretch with hollow noodle,  * quad <> hip flexor stretch with foot on solid-> yellow noodle  * suitcase carry with single blue hand float - walking forward/ backward * UE on blue hand floats:  3 way LE kick ,2 x 10, cues to only touch toe to back, improved tolerance  * calf stretch -> heels off bottom ladder hole x 20s;  BKTC stretch with feet in bottom ladder hole, and UE on rails (good tolerance) * TrA set with noodle-> yellow hand  float pull downs to thighs in wide and  staggered stance  * plank on blue hand floats -> superman to/from plank -> alternating leg lifts (very small range)   12/04/23 Therapeutic exercise: Nustep L5 x 5 min Supine HS stretch w/ strap 2x30" bil Supine LTR 5x10" bil Seated piriformis stretch x 30" bil Seated figure 4 stretch x 30" bil Standing lumbar ext 10x3" Prone press up x 10  Manual therapy: STM to L mid hamstrings 11/28/23 Therapeutic exercise: Nustep L5 x 5 min Seated piriformis stretch x 30" Seated figure 4 stretch x 30" Seated hamstring stretch 2x 30" Supine ITB stretch with strap 2x 30"  Neuromuscular Re-ed: Sidelying clamshell yellow TB 2x10 Sidelying reverse clam yellow TB 2x10 Sidelying hip abd yellow TB 2x10 Sidelying hip ext yellow TB 2x10 Prone hamstring curl yellow TB 2x10 Standing palloff press yellow TB (doubled) x10  Manual therapy:  Skilled assessment and palpation for TPDN  STM & TPR glute and piriformis  Trigger Point Dry Needling  Subsequent Treatment: Instructions provided previously at initial dry needling treatment.   Patient Verbal Consent Given: Yes Education Handout Provided: Previously Provided Muscles Treated: R glute max, glute med and piriformis Electrical Stimulation Performed: No Treatment Response/Outcome: twitch response, Decreased muscle tension  11/21/23 See HEP below  Manual therapy  Skilled assessment and palpation for TPDN  STM & TPR glute and piriformis  Trigger Point Dry Needling  Subsequent Treatment: Instructions provided previously at initial dry needling treatment.   Patient Verbal Consent Given: Yes Education Handout Provided: Previously Provided Muscles Treated: R glute and piriformis Electrical Stimulation Performed: No Treatment Response/Outcome: Decreased muscle tension     PATIENT EDUCATION:  Education details: reacquainting to aquatic therapy  Person educated: Patient Education method: Programmer, multimedia, Demonstration Education comprehension:  verbalized understanding, returned demonstration, and needs further education  HOME EXERCISE PROGRAM: Access Code: [email or web address] URL: [email or web address] Date: 12/11/2023 Prepared by: Tyarra Nolton  Exercises - Seated Piriformis Stretch  - 1 x daily - 7 x weekly - 1 sets - 1 min hold - Seated Piriformis Stretch  - 1 x daily - 7 x weekly - 1 sets - 1 min hold - Standing Glute Med Mobilization with Small Ball on Wall  - 1 x daily - 7 x weekly - 1 sets - 1 min hold - Seated Sciatic Tensioner  - 1 x daily - 7 x weekly - 1 sets - 10 reps - Supine ITB Stretch with Strap  - 1 x daily - 7 x weekly - 2 sets - 30 sec hold - Clamshell with Resistance  - 1 x daily - 7 x weekly - 2 sets - 10 reps - Sidelying Hip Abduction with Resistance at Thighs  - 1 x daily - 7 x weekly - 2 sets - 10 reps - Sidelying Hip Abduction and Extension with Loop Band  - 1 x daily - 7 x weekly - 2 sets - 10 reps - Standing Anti-Rotation Press with Anchored Resistance  - 1 x daily - 7 x weekly - 2 sets - 10 reps - Supine Hamstring Stretch with Strap  - 1 x daily - 7 x weekly - 2 sets - 2 reps - 30 sec hold  ASSESSMENT:  CLINICAL IMPRESSION: Continued working on stretching and decreasing muscle tension in bil glutes and R lower leg. Progressed stretches and strengthening today with good response. Pt responded well to treatment.   From eval: Patient is a 68 y.o. M who was seen today for physical therapy evaluation  and treatment for R LE radiating pain. Assessment significant for bilat hip muscle tightness (R tighter than L) especially in glutes and piriformis, LE posterior chain weakness (R weaker than L), and high sciatic nerve tension in R LE. S/s appear most consistent to sciatica due to hip muscle tightness and weakness affecting pt's ability to tolerate prolonged sitting and standing. Pt will greatly benefit from PT to improve on these deficits. Performed dry needling today to reduce pt's R posterior hip tension.  Plans for aquatics.   OBJECTIVE IMPAIRMENTS: decreased activity tolerance, decreased balance, decreased coordination, decreased endurance, decreased mobility, difficulty walking, decreased ROM, decreased strength, hypomobility, increased fascial restrictions, increased muscle spasms, impaired flexibility, improper body mechanics, postural dysfunction, and pain.   ACTIVITY LIMITATIONS: sitting, standing, and squatting  PARTICIPATION LIMITATIONS: driving and community activity  PERSONAL FACTORS: Age, Fitness, Past/current experiences, and Time since onset of injury/illness/exacerbation are also affecting patient's functional outcome.   REHAB POTENTIAL: Good  CLINICAL DECISION MAKING: Evolving/moderate complexity  EVALUATION COMPLEXITY: Moderate   GOALS: Goals reviewed with patient? Yes  SHORT TERM GOALS: Target date: 12/12/2023  Pt will be ind with initial HEP Baseline: Goal status: MET- 12/11/23   2.  Pt will have no radiating symptoms when performing SLR test to demo reduced neural tension Baseline:  Goal status: MET- 12/11/23    LONG TERM GOALS: Target date: 01/02/2024   Pt will be ind with management and progression of land and aquatics HEP Baseline:  Goal status: INITIAL  2.  Pt will have improved bilat LE strength to at least 4+/5 for increased standing tolerance and stability Baseline:  Goal status: INITIAL  3.  Pt will be able to tolerate standing >/=15 min without radiating symptoms Baseline:  Goal status: INITIAL  4.  Pt will have improved LEFS to >/=58/80 to demo MCID Baseline:  Goal status: INITIAL   PLAN:  PT FREQUENCY: 2x/week  PT DURATION: 6 weeks  PLANNED INTERVENTIONS: 97164- PT Re-evaluation, 97110-Therapeutic exercises, 97530- Therapeutic activity, 97112- Neuromuscular re-education, 97535- Self Care, 16109- Manual therapy, [phone]- Gait training, [phone]- Aquatic Therapy, [phone]- Electrical stimulation (unattended), [phone]- Ionotophoresis 4mg /ml  Dexamethasone , Patient/Family education, Balance training, Stair training, Taping, Dry Needling, Joint mobilization, Spinal mobilization, Cryotherapy, and Moist heat  PLAN FOR NEXT SESSION: Assess response to HEP. Initiate glute and hamstring strengthening. Continue to stretch glutes/piriformis/hamstring and sciatic nerve. Needling/manual work   Samuella Crocker, PTA  12/11/23 11:49 AM

## 2023-12-12 ENCOUNTER — Encounter (HOSPITAL_BASED_OUTPATIENT_CLINIC_OR_DEPARTMENT_OTHER): Payer: Self-pay | Admitting: Physical Therapy

## 2023-12-13 ENCOUNTER — Ambulatory Visit (HOSPITAL_BASED_OUTPATIENT_CLINIC_OR_DEPARTMENT_OTHER): Admitting: Physical Therapy

## 2023-12-19 ENCOUNTER — Encounter: Payer: Self-pay | Admitting: Physical Therapy

## 2023-12-19 ENCOUNTER — Ambulatory Visit: Attending: Orthopedic Surgery | Admitting: Physical Therapy

## 2023-12-19 DIAGNOSIS — R262 Difficulty in walking, not elsewhere classified: Secondary | ICD-10-CM | POA: Diagnosis present

## 2023-12-19 DIAGNOSIS — M533 Sacrococcygeal disorders, not elsewhere classified: Secondary | ICD-10-CM | POA: Diagnosis present

## 2023-12-19 DIAGNOSIS — M5417 Radiculopathy, lumbosacral region: Secondary | ICD-10-CM | POA: Insufficient documentation

## 2023-12-19 DIAGNOSIS — M5431 Sciatica, right side: Secondary | ICD-10-CM | POA: Diagnosis present

## 2023-12-19 DIAGNOSIS — M6281 Muscle weakness (generalized): Secondary | ICD-10-CM | POA: Diagnosis present

## 2023-12-19 NOTE — Therapy (Signed)
 OUTPATIENT PHYSICAL THERAPY TREATMENT   Patient Name: Micheal Lucas MRN: [id number] DOB:Jan 21, 1956, 68 y.o., male Today's Date: 12/19/2023  END OF SESSION:  PT End of Session - 12/19/23 0841     Visit Number 6    Number of Visits 12    Date for PT Re-Evaluation 01/02/24    Authorization Type BCBS    PT Start Time 0845    PT Stop Time 0925    PT Time Calculation (min) 40 min    Activity Tolerance Patient tolerated treatment well;No increased pain    Behavior During Therapy WFL for tasks assessed/performed              Past Medical History:  Diagnosis Date   Anemia    Arthritis    oa   Bruit of right carotid artery    Cataract    right eye   Elevated cholesterol    GERD (gastroesophageal reflux disease)    Hypertension    Plantar fascial fibromatosis    Past Surgical History:  Procedure Laterality Date   colonscopy  age 27   EYE SURGERY Left 04/10/2016   cataract surgery   EYE SURGERY Bilateral 2015   stents   knee scope and cartlidge implant Left 2009   TOTAL KNEE ARTHROPLASTY Right 05/23/2016   Procedure: RIGHT TOTAL KNEE ARTHROPLASTY;  Surgeon: Claiborne Crew, MD;  Location: [facility];  Service: Orthopedics;  Laterality: Right;  Adductor block   TOTAL KNEE REVISION Right 10/19/2022   Procedure: TOTAL KNEE REVISION;  Surgeon: Claiborne Crew, MD;  Location: [facility];  Service: Orthopedics;  Laterality: Right;   Patient Active Problem List   Diagnosis Date Noted   S/P revision of total knee, 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 Knees are a little sore. Feels that intensity has improved. Feels less radiating pain with sitting but can still feel it with standing. Pt states he did get an SI injection last week. States aquatics was really good.   PERTINENT HISTORY: TKA May 2024, L SI joint fusion January 2025  PAIN:  Are you having pain? Yes: NPRS scale: 3/10 Pain location: R posterior hip and radiates down posterior leg to calf Pain description: dull and sharp Aggravating factors: Sitting and standing (~5-10 min) Relieving factors: Laying flat  PRECAUTIONS: None  RED FLAGS: None   WEIGHT BEARING RESTRICTIONS: No  FALLS:  Has patient fallen in last 6 months? No  LIVING ENVIRONMENT: Lives with: lives with their spouse Lives in: House/apartment Stairs: Yes: External: 2 steps; none Has following equipment at home: None  OCCUPATION: Retired -- Quarry manager, wants to travel  PLOF: Independent  PATIENT GOALS: get back to traveling  NEXT MD VISIT: n/a  OBJECTIVE:  Note: Objective measures were completed at Evaluation unless otherwise noted.  DIAGNOSTIC FINDINGS: nothing new on Epic  PATIENT SURVEYS:  LEFS 49/80     SENSATION: WFL  MUSCLE LENGTH: Hamstrings: Right tighter than left and reproduced his nerve pain  POSTURE: increased lumbar lordosis  PALPATION: TTP L glute and piriformis  LUMBAR ROM:  Active  A/PROM  eval  Flexion 90%  Extension 100%  Right lateral flexion 50%  Left lateral flexion 90%  Right rotation 90%  Left rotation 100%   (Blank rows = not tested)   LOWER EXTREMITY ROM:  Active ROM Right eval Left eval  Hip flexion    Hip extension    Hip abduction Limited compared to L   Hip adduction    Hip internal rotation    Hip external rotation    Knee flexion    Knee extension ~5-10 away from full ext   Ankle dorsiflexion    Ankle plantarflexion    Ankle inversion    Ankle eversion     (Blank  rows = not tested)  LOWER EXTREMITY MMT:  MMT Right eval Left eval  Hip flexion 5 5  Hip extension 3- 3+  Hip abduction 3- 3+  Hip adduction    Hip internal rotation    Hip external rotation    Knee flexion 3+ 3+  Knee extension 5 5  Ankle dorsiflexion    Ankle plantarflexion    Ankle inversion    Ankle eversion     (Blank rows = not tested)  LOWER EXTREMITY SPECIAL TESTS:  Hip special tests: Portia Brittle (FABER) test: negative and Thomas test: negative SLR test: (+) on R  FUNCTIONAL TESTS:  5 times sit to stand: TBA Double leg lowering test: TBA  GAIT: Distance walked: Into clinic Assistive device utilized: None Level of assistance: Complete Independence Comments: Normal reciprocal pattern                                                                                                                                TREATMENT DATE:  12/19/23 Nustep L5 x 5 min Supine hamstring stretch with strap 2x30" knee straight and then knee bent R&L Supine ITB stretch with strap 2x30" R&L Supine piriformis stretch 2x 30" R&L Supine PPT x10 Supine PPT into bridge 2x10 Prone quad stretch with strap x30" R&L Prone hip ext with knee flexed 2x10 (PT assist on R) Staggered stance modified deadlift x10 Gastroc stretch standing x30" Standing fire hydrant red TB 2x10   12/11/23 Therapeutic exercise: Nustep L5 x 5 min Supine KTOS piriformis stretch x 30" bil Supine SKTC stretch x 30" bil S/L clamshell x 10 bil S/L hip abduction x 10 bil Seated KTOS and figure 4 stretch x 30" bil  NMR: Standing shoulder extension RTB x 10  Pallof press RTB x 10 bil  Manual Therapy: STM to bil glutes, R gastroc  Pt seen for aquatic therapy today.  Treatment took place in water  3.5-4.75 ft in depth at the [facility] pool. Temp of water  was 91.  Pt entered/exited the pool via stairs independently with bilat rail.   *walking forward/ backward multiple laps  * side stepping into wide squat  with arm addct with blue hand floats x 3 laps * UE on blue hand floats:  single leg clam 2 x 10 each LE * 3 way LE stretch with hollow noodle,  * quad <> hip flexor stretch with foot on solid-> yellow noodle  * suitcase carry with single blue hand float - walking forward/ backward * UE on blue hand floats:  3 way LE kick ,2 x 10, cues to only touch toe to back, improved tolerance  * calf stretch -> heels off bottom ladder hole x 20s;  BKTC stretch with feet in bottom ladder hole, and UE on rails (good tolerance) * TrA set with noodle-> yellow hand float pull downs to thighs in wide and staggered stance  * plank on blue hand floats -> superman to/from plank -> alternating leg lifts (very small range)   12/04/23 Therapeutic exercise: Nustep L5 x 5 min Supine HS stretch w/ strap 2x30" bil Supine LTR 5x10" bil Seated piriformis stretch x 30" bil Seated figure 4 stretch x 30" bil Standing lumbar ext 10x3" Prone press up x 10  Manual therapy: STM to L mid hamstrings  11/28/23 Therapeutic exercise: Nustep L5 x 5 min Seated piriformis stretch x 30" Seated figure 4 stretch x 30" Seated hamstring stretch 2x 30" Supine ITB stretch with strap 2x 30"  Neuromuscular Re-ed: Sidelying clamshell yellow TB 2x10 Sidelying reverse clam yellow TB 2x10 Sidelying hip abd yellow TB 2x10 Sidelying hip ext yellow TB 2x10 Prone hamstring curl yellow TB 2x10 Standing palloff press yellow TB (doubled) x10  Manual therapy:  Skilled assessment and palpation for TPDN  STM & TPR glute and piriformis  Trigger Point Dry Needling  Subsequent Treatment: Instructions provided previously at initial dry needling treatment.   Patient Verbal Consent Given: Yes Education Handout Provided: Previously Provided Muscles Treated: R glute max, glute med and piriformis Electrical Stimulation Performed: No Treatment Response/Outcome: twitch response, Decreased muscle tension     PATIENT EDUCATION:  Education  details: reacquainting to aquatic therapy  Person educated: Patient Education method: Programmer, multimedia, Demonstration Education comprehension: verbalized understanding, returned demonstration, and needs further education  HOME EXERCISE PROGRAM: Access Code: [email or web address] URL: [email or web address] Date: 12/11/2023 Prepared by: Braylin Clark  Exercises - Seated Piriformis Stretch  - 1 x daily - 7 x weekly - 1 sets - 1 min hold - Seated Piriformis Stretch  - 1 x daily - 7 x weekly - 1 sets - 1 min hold - Standing Glute Med Mobilization with Small Ball on Wall  - 1 x daily - 7 x weekly - 1 sets - 1 min hold - Seated Sciatic Tensioner  - 1 x daily - 7 x weekly - 1 sets - 10 reps - Supine ITB Stretch with Strap  - 1 x daily - 7 x weekly - 2 sets - 30 sec hold - Clamshell with Resistance  - 1 x daily - 7 x weekly - 2 sets - 10 reps - Sidelying Hip Abduction with Resistance at Thighs  - 1 x daily - 7 x weekly - 2 sets - 10 reps - Sidelying Hip Abduction and Extension with Loop Band  - 1 x daily - 7 x weekly - 2 sets - 10 reps - Standing Anti-Rotation Press with Anchored Resistance  - 1 x daily - 7 x weekly - 2 sets - 10 reps - Supine Hamstring Stretch with Strap  - 1 x daily - 7 x weekly - 2 sets - 2 reps - 30 sec hold  ASSESSMENT:  CLINICAL IMPRESSION: Continued stretching hamstring and glutes to decrease pressure on sciatic nerve. Working  on increasing glute strength in different positions but mainly focused on improving glute stability in standing today for improved standing tolerance.   From eval: Patient is a 68 y.o. M who was seen today for physical therapy evaluation and treatment for R LE radiating pain. Assessment significant for bilat hip muscle tightness (R tighter than L) especially in glutes and piriformis, LE posterior chain weakness (R weaker than L), and high sciatic nerve tension in R LE. S/s appear most consistent to sciatica due to hip muscle tightness and weakness  affecting pt's ability to tolerate prolonged sitting and standing. Pt will greatly benefit from PT to improve on these deficits. Performed dry needling today to reduce pt's R posterior hip tension. Plans for aquatics.   OBJECTIVE IMPAIRMENTS: decreased activity tolerance, decreased balance, decreased coordination, decreased endurance, decreased mobility, difficulty walking, decreased ROM, decreased strength, hypomobility, increased fascial restrictions, increased muscle spasms, impaired flexibility, improper body mechanics, postural dysfunction, and pain.   ACTIVITY LIMITATIONS: sitting, standing, and squatting  PARTICIPATION LIMITATIONS: driving and community activity  PERSONAL FACTORS: Age, Fitness, Past/current experiences, and Time since onset of injury/illness/exacerbation are also affecting patient's functional outcome.   REHAB POTENTIAL: Good  CLINICAL DECISION MAKING: Evolving/moderate complexity  EVALUATION COMPLEXITY: Moderate   GOALS: Goals reviewed with patient? Yes  SHORT TERM GOALS: Target date: 12/12/2023  Pt will be ind with initial HEP Baseline: Goal status: MET- 12/11/23   2.  Pt will have no radiating symptoms when performing SLR test to demo reduced neural tension Baseline:  Goal status: MET- 12/11/23    LONG TERM GOALS: Target date: 01/02/2024   Pt will be ind with management and progression of land and aquatics HEP Baseline:  Goal status: INITIAL  2.  Pt will have improved bilat LE strength to at least 4+/5 for increased standing tolerance and stability Baseline:  Goal status: INITIAL  3.  Pt will be able to tolerate standing >/=15 min without radiating symptoms Baseline:  Goal status: INITIAL  4.  Pt will have improved LEFS to >/=58/80 to demo MCID Baseline:  Goal status: INITIAL   PLAN:  PT FREQUENCY: 2x/week  PT DURATION: 6 weeks  PLANNED INTERVENTIONS: 97164- PT Re-evaluation, 97110-Therapeutic exercises, 97530- Therapeutic activity,  97112- Neuromuscular re-education, 97535- Self Care, 16109- Manual therapy, [phone]- Gait training, [phone]- Aquatic Therapy, [phone]- Electrical stimulation (unattended), [phone]- Ionotophoresis 4mg /ml Dexamethasone , Patient/Family education, Balance training, Stair training, Taping, Dry Needling, Joint mobilization, Spinal mobilization, Cryotherapy, and Moist heat  PLAN FOR NEXT SESSION: Assess response to HEP. Initiate glute and hamstring strengthening. Continue to stretch glutes/piriformis/hamstring and sciatic nerve. Needling/manual work   Noura Purpura April Larri Ply Alaine Loughney, [place]  12/19/23 8:41 AM

## 2023-12-21 ENCOUNTER — Encounter (HOSPITAL_BASED_OUTPATIENT_CLINIC_OR_DEPARTMENT_OTHER): Payer: Self-pay | Admitting: Physical Therapy

## 2023-12-21 ENCOUNTER — Ambulatory Visit (HOSPITAL_BASED_OUTPATIENT_CLINIC_OR_DEPARTMENT_OTHER): Attending: Orthopedic Surgery | Admitting: Physical Therapy

## 2023-12-21 DIAGNOSIS — M5417 Radiculopathy, lumbosacral region: Secondary | ICD-10-CM | POA: Insufficient documentation

## 2023-12-21 DIAGNOSIS — M5431 Sciatica, right side: Secondary | ICD-10-CM | POA: Insufficient documentation

## 2023-12-21 DIAGNOSIS — M6281 Muscle weakness (generalized): Secondary | ICD-10-CM | POA: Insufficient documentation

## 2023-12-21 NOTE — Therapy (Signed)
 OUTPATIENT PHYSICAL THERAPY TREATMENT   Patient Name: Micheal Lucas MRN: [id number] DOB:[birth year], 68 y.o., male Today's Date: 12/21/2023  END OF SESSION:  PT End of Session - 12/21/23 0857     Visit Number 7    Number of Visits 12    Date for PT Re-Evaluation 01/02/24    Authorization Type BCBS    PT Start Time 0845    PT Stop Time 0928    PT Time Calculation (min) 43 min    Activity Tolerance Patient tolerated treatment well    Behavior During Therapy [facility] for tasks assessed/performed              Past Medical History:  Diagnosis Date   Anemia    Arthritis    oa   Bruit of right carotid artery    Cataract    right eye   Elevated cholesterol    GERD (gastroesophageal reflux disease)    Hypertension    Plantar fascial fibromatosis    Past Surgical History:  Procedure Laterality Date   colonscopy  age 81   EYE SURGERY Left 04/10/2016   cataract surgery   EYE SURGERY Bilateral 2015   stents   knee scope and cartlidge implant Left 2009   TOTAL KNEE ARTHROPLASTY Right 05/23/2016   Procedure: RIGHT TOTAL KNEE ARTHROPLASTY;  Surgeon: Claiborne Crew, MD;  Location: [facility];  Service: Orthopedics;  Laterality: Right;  Adductor block   TOTAL KNEE REVISION Right 10/19/2022   Procedure: TOTAL KNEE REVISION;  Surgeon: Claiborne Crew, MD;  Location: [facility];  Service: Orthopedics;  Laterality: Right;   Patient Active Problem List   Diagnosis Date Noted   S/P revision of total knee, 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   Obese 05/24/2016   S/P right TKA 05/23/2016   S/P knee replacement 05/23/2016    PCP: Ishmael Marcus, DO  REFERRING PROVIDER: Mort Ards, MD  REFERRING DIAG:   THERAPY DIAG:  Sciatica, right side  Muscle weakness (generalized)  Radiculopathy, lumbosacral region  Rationale for Evaluation and Treatment: Rehabilitation  ONSET DATE: 09/14/23  SUBJECTIVE:   SUBJECTIVE STATEMENT: Pt reports that the injection in back/SI helped for 5-6 days. No pain in  back, but R knee is tight and painful. Pt reports that he has been going to pool everyday.  Feels good with program, with the modifications made last time.   PERTINENT HISTORY: TKA May 2024, L SI joint fusion January 2025  PAIN:  Are you having pain? Yes: NPRS scale: 6-7/10 Pain location: R knee/thigh Pain description: dull and sharp Aggravating factors: Sitting and standing (~5-10 min) Relieving factors: Laying flat  PRECAUTIONS: None  RED FLAGS: None   WEIGHT BEARING RESTRICTIONS: No  FALLS:  Has patient fallen in last 6 months? No  LIVING ENVIRONMENT: Lives with: lives with their spouse Lives in: House/apartment Stairs: Yes: External: 2 steps; none Has following equipment at home: None  OCCUPATION: Retired -- Quarry manager, wants to travel  PLOF: Independent  PATIENT GOALS: get back to traveling  NEXT MD VISIT: n/a  OBJECTIVE:  Note: Objective measures were completed at Evaluation unless otherwise noted.  DIAGNOSTIC FINDINGS: nothing new on Epic  PATIENT SURVEYS:  LEFS 49/80    SENSATION: WFL  MUSCLE LENGTH: Hamstrings: Right tighter than left and reproduced his nerve pain  POSTURE: increased lumbar lordosis  PALPATION: TTP L glute and piriformis  LUMBAR ROM:   Active  A/PROM  eval  Flexion 90%  Extension 100%  Right lateral flexion 50%  Left lateral flexion 90%  Right rotation 90%  Left rotation 100%   (Blank rows = not tested)   LOWER EXTREMITY ROM:  Active ROM Right eval Left eval  Hip flexion    Hip extension    Hip abduction Limited compared to L   Hip adduction    Hip internal rotation    Hip external rotation    Knee flexion    Knee extension ~5-10 away from full ext   Ankle dorsiflexion    Ankle plantarflexion    Ankle inversion    Ankle eversion     (Blank rows = not tested)  LOWER EXTREMITY MMT:  MMT Right eval Left eval  Hip flexion 5 5  Hip extension 3- 3+  Hip abduction 3- 3+  Hip adduction    Hip internal rotation     Hip external rotation    Knee flexion 3+ 3+  Knee extension 5 5  Ankle dorsiflexion    Ankle plantarflexion    Ankle inversion    Ankle eversion     (Blank rows = not tested)  LOWER EXTREMITY SPECIAL TESTS:  Hip special tests: Portia Brittle (FABER) test: negative and Thomas test: negative SLR test: (+) on R  FUNCTIONAL TESTS:  5 times sit to stand: TBA Double leg lowering test: TBA  GAIT: Distance walked: Into clinic Assistive device utilized: None Level of assistance: Complete Independence Comments: Normal reciprocal pattern                                                                                                                                TREATMENT DATE:  12/21/23 Pt seen for aquatic therapy today.  Treatment took place in water  3.5-4.75 ft in depth at the [facility] pool. Temp of water  was 91.  Pt entered/exited the pool via stairs independently with bilat rail.   On deck- IASTM to R ant thigh to decrease fascial restrictions and improve mobility  * suitcase carry with single blue hand float - walking forward/ backward  * calf stretch -> heels off bottom ladder hole x 20s x 2;  BKTC stretch with feet in bottom ladder hole with/without wag the tail * UE on blue hand floats:  single leg clam 2 x 10 each LE * UE on blue hand floats:  3 way LE kick ,2 x 10, cues to only touch toe to back * side stepping with arm addct with blue hand floats x 3 laps * fig 4 stretch at wall x 20s x 2  * plank on blue hand floats -> superman to/from plank -> alternating leg lifts (very small range) * 3 way LE stretch with hollow noodle,  * quad <> hip flexor stretch with foot on hollow noodle  * TrA set with noodle-> yellow hand float pull downs to thighs in wide and staggered stance * cycling on noodle in 65ft lap lane   12/19/23 Nustep L5 x 5 min Supine hamstring stretch with strap 2x30" knee  straight and then knee bent R&L Supine ITB stretch with strap 2x30" R&L Supine  piriformis stretch 2x 30" R&L Supine PPT x10 Supine PPT into bridge 2x10 Prone quad stretch with strap x30" R&L Prone hip ext with knee flexed 2x10 (PT assist on R) Staggered stance modified deadlift x10 Gastroc stretch standing x30" Standing fire hydrant red TB 2x10   12/11/23 Therapeutic exercise: Nustep L5 x 5 min Supine KTOS piriformis stretch x 30" bil Supine SKTC stretch x 30" bil S/L clamshell x 10 bil S/L hip abduction x 10 bil Seated KTOS and figure 4 stretch x 30" bil  NMR: Standing shoulder extension RTB x 10  Pallof press RTB x 10 bil  Manual Therapy: STM to bil glutes, R gastroc  PATIENT EDUCATION:  Education details: finalizing aquatic HEP - issued new laminated copy Person educated: Patient Education method: Programmer, multimedia, Demonstration Education comprehension: verbalized understanding, returned demonstration, and needs further education  HOME EXERCISE PROGRAM: Access Code: [email or web address] URL: [email or web address] Date: 12/11/2023 Prepared by: Braylin Clark  Exercises - Seated Piriformis Stretch  - 1 x daily - 7 x weekly - 1 sets - 1 min hold - Seated Piriformis Stretch  - 1 x daily - 7 x weekly - 1 sets - 1 min hold - Standing Glute Med Mobilization with Small Ball on Wall  - 1 x daily - 7 x weekly - 1 sets - 1 min hold - Seated Sciatic Tensioner  - 1 x daily - 7 x weekly - 1 sets - 10 reps - Supine ITB Stretch with Strap  - 1 x daily - 7 x weekly - 2 sets - 30 sec hold - Clamshell with Resistance  - 1 x daily - 7 x weekly - 2 sets - 10 reps - Sidelying Hip Abduction with Resistance at Thighs  - 1 x daily - 7 x weekly - 2 sets - 10 reps - Sidelying Hip Abduction and Extension with Loop Band  - 1 x daily - 7 x weekly - 2 sets - 10 reps - Standing Anti-Rotation Press with Anchored Resistance  - 1 x daily - 7 x weekly - 2 sets - 10 reps - Supine Hamstring Stretch with Strap  - 1 x daily - 7 x weekly - 2 sets - 2 reps - 30 sec hold  Aquatic Access  Code: [email or web address] URL: [email or web address] Date: 12/21/2023 ASSESSMENT:  CLINICAL IMPRESSION: Pt had good response to modifications to aquatic HEP from last session.  Finalized HEP and issued updated version.  Good tolerance for all exercises with gentle reduction of knee tightness/discomfort and no pain in back.  Pt has met max potential in aquatic setting and verbalized readiness to d/c from aquatic therapy. Will continue PT on land.      From eval: Patient is a 69 y.o. M who was seen today for physical therapy evaluation and treatment for R LE radiating pain. Assessment significant for bilat hip muscle tightness (R tighter than L) especially in glutes and piriformis, LE posterior chain weakness (R weaker than L), and high sciatic nerve tension in R LE. S/s appear most consistent to sciatica due to hip muscle tightness and weakness affecting pt's ability to tolerate prolonged sitting and standing. Pt will greatly benefit from PT to improve on these deficits. Performed dry needling today to reduce pt's R posterior hip tension. Plans for aquatics.   OBJECTIVE IMPAIRMENTS: decreased activity tolerance, decreased balance, decreased coordination, decreased endurance, decreased mobility, difficulty walking, decreased ROM, decreased strength, hypomobility, increased fascial restrictions,  increased muscle spasms, impaired flexibility, improper body mechanics, postural dysfunction, and pain.   ACTIVITY LIMITATIONS: sitting, standing, and squatting  PARTICIPATION LIMITATIONS: driving and community activity  PERSONAL FACTORS: Age, Fitness, Past/current experiences, and Time since onset of injury/illness/exacerbation are also affecting patient's functional outcome.   REHAB POTENTIAL: Good  CLINICAL DECISION MAKING: Evolving/moderate complexity  EVALUATION COMPLEXITY: Moderate   GOALS: Goals reviewed with patient? Yes  SHORT TERM GOALS: Target date: 12/12/2023  Pt will be ind with  initial HEP Baseline: Goal status: MET- 12/11/23   2.  Pt will have no radiating symptoms when performing SLR test to demo reduced neural tension Baseline:  Goal status: MET- 12/11/23    LONG TERM GOALS: Target date: 01/02/2024   Pt will be ind with management and progression of land and aquatics HEP Baseline:  Goal status: INITIAL  2.  Pt will have improved bilat LE strength to at least 4+/5 for increased standing tolerance and stability Baseline:  Goal status: INITIAL  3.  Pt will be able to tolerate standing >/=15 min without radiating symptoms Baseline:  Goal status: INITIAL  4.  Pt will have improved LEFS to >/=58/80 to demo MCID Baseline:  Goal status: INITIAL   PLAN:  PT FREQUENCY: 2x/week  PT DURATION: 6 weeks  PLANNED INTERVENTIONS: 97164- PT Re-evaluation, 97110-Therapeutic exercises, 97530- Therapeutic activity, 97112- Neuromuscular re-education, 97535- Self Care, 16109- Manual therapy, [phone]- Gait training, [phone]- Aquatic Therapy, [phone]- Electrical stimulation (unattended), [phone]- Ionotophoresis 4mg /ml Dexamethasone , Patient/Family education, Balance training, Stair training, Taping, Dry Needling, Joint mobilization, Spinal mobilization, Cryotherapy, and Moist heat  PLAN FOR NEXT SESSION: Assess response to HEP. Initiate glute and hamstring strengthening. Continue to stretch glutes/piriformis/hamstring and sciatic nerve. Needling/manual work   Almedia Jacobsen, [place] 12/21/23 9:49 AM [facility] [address] [place], [place], [address] Phone: [phone]   Fax:  [phone]

## 2023-12-26 ENCOUNTER — Ambulatory Visit

## 2023-12-26 DIAGNOSIS — M5431 Sciatica, right side: Secondary | ICD-10-CM | POA: Diagnosis not present

## 2023-12-26 DIAGNOSIS — M5417 Radiculopathy, lumbosacral region: Secondary | ICD-10-CM

## 2023-12-26 DIAGNOSIS — M6281 Muscle weakness (generalized): Secondary | ICD-10-CM

## 2023-12-26 NOTE — Therapy (Signed)
 OUTPATIENT PHYSICAL THERAPY TREATMENT   Patient Name: Micheal Lucas MRN: [id number] DOB:1955/12/17, 68 y.o., male Today's Date: 12/26/2023  END OF SESSION:  PT End of Session - 12/26/23 0928     Visit Number 8    Number of Visits 12    Date for PT Re-Evaluation 01/02/24    Authorization Type BCBS    PT Start Time [phone]    PT Stop Time 0930    PT Time Calculation (min) 44 min    Activity Tolerance Patient tolerated treatment well    Behavior During Therapy [facility] for tasks assessed/performed               Past Medical History:  Diagnosis Date   Anemia    Arthritis    oa   Bruit of right carotid artery    Cataract    right eye   Elevated cholesterol    GERD (gastroesophageal reflux disease)    Hypertension    Plantar fascial fibromatosis    Past Surgical History:  Procedure Laterality Date   colonscopy  age 36   EYE SURGERY Left 04/10/2016   cataract surgery   EYE SURGERY Bilateral 2015   stents   knee scope and cartlidge implant Left 2009   TOTAL KNEE ARTHROPLASTY Right 05/23/2016   Procedure: RIGHT TOTAL KNEE ARTHROPLASTY;  Surgeon: Claiborne Crew, MD;  Location: [facility];  Service: Orthopedics;  Laterality: Right;  Adductor block   TOTAL KNEE REVISION Right 10/19/2022   Procedure: TOTAL KNEE REVISION;  Surgeon: Claiborne Crew, MD;  Location: [facility];  Service: Orthopedics;  Laterality: Right;   Patient Active Problem List   Diagnosis Date Noted   S/P revision of total knee, 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 His R knee and  thigh are bothering him today.   PERTINENT HISTORY: TKA May 2024, L SI joint fusion January 2025  PAIN:  Are you having pain? Yes: NPRS scale: 6-7/10 Pain location: R knee/thigh Pain description: dull and sharp Aggravating factors: Sitting and standing (~5-10 min) Relieving factors: Laying flat  PRECAUTIONS: None  RED FLAGS: None   WEIGHT BEARING RESTRICTIONS: No  FALLS:  Has patient fallen in last 6 months? No  LIVING ENVIRONMENT: Lives with: lives with their spouse Lives in: House/apartment Stairs: Yes: External: 2 steps; none Has following equipment at home: None  OCCUPATION: Retired -- Quarry manager, wants to travel  PLOF: Independent  PATIENT GOALS: get back to traveling  NEXT MD VISIT: n/a  OBJECTIVE:  Note: Objective measures were completed at Evaluation unless otherwise noted.  DIAGNOSTIC FINDINGS: nothing new on Epic  PATIENT SURVEYS:  LEFS 49/80    SENSATION: WFL  MUSCLE LENGTH: Hamstrings: Right tighter than left and reproduced his nerve pain  POSTURE: increased lumbar lordosis  PALPATION: TTP L glute and piriformis  LUMBAR ROM:   Active  A/PROM  eval  Flexion 90%  Extension 100%  Right lateral flexion 50%  Left lateral flexion 90%  Right rotation 90%  Left rotation 100%   (Blank rows = not tested)   LOWER EXTREMITY ROM:  Active ROM Right eval Left eval  Hip flexion    Hip extension    Hip abduction Limited compared to L   Hip adduction    Hip internal rotation    Hip external rotation    Knee flexion    Knee extension ~5-10 away from full ext   Ankle dorsiflexion    Ankle plantarflexion    Ankle inversion    Ankle eversion     (Blank rows = not tested)  LOWER EXTREMITY MMT:  MMT Right eval Left eval R 12/26/23 L 12/26/23  Hip flexion 5 5    Hip extension 3- 3+ 4- 4  Hip abduction 3- 3+ 4 4+  Hip adduction      Hip internal rotation      Hip external rotation      Knee flexion 3+ 3+ 5 5  Knee extension 5 5    Ankle  dorsiflexion      Ankle plantarflexion      Ankle inversion      Ankle eversion       (Blank rows = not tested)  LOWER EXTREMITY SPECIAL TESTS:  Hip special tests: Portia Brittle (FABER) test: negative and Thomas test: negative SLR test: (+) on R  FUNCTIONAL TESTS:  5 times sit to stand: TBA Double leg lowering test: TBA  GAIT: Distance walked: Into clinic Assistive device utilized: None Level of assistance: Complete Independence Comments: Normal reciprocal pattern                                                                                                                                TREATMENT DATE:  12/26/23 Nustep L5x65min UE/LE Supine quad stretch with strap 2x30" B Strength reassessed High straight leg raises x 10 BLE Leg press 25lb 2x10; 35lb x 10 BLE Standing cable hip ext and abduction 5lb x 10 BLE Leg curls 45lb 3x10 BLE 12/21/23 Pt seen for aquatic therapy today.  Treatment took place in water  3.5-4.75 ft in depth at the [facility] pool. Temp of water  was 91.  Pt entered/exited the pool via stairs independently with bilat rail.   On deck- IASTM to R ant thigh to decrease fascial restrictions and improve mobility  * suitcase carry with single blue hand float - walking forward/ backward  * calf stretch -> heels off bottom ladder hole x 20s x 2;  BKTC stretch with feet in bottom ladder hole with/without wag the tail * UE on blue hand floats:  single leg clam 2 x 10 each LE * UE on blue hand floats:  3 way LE kick ,2 x 10, cues to only touch toe to back * side stepping with arm addct with blue hand floats x 3 laps * fig 4 stretch at wall x 20s x 2  * plank on blue hand floats -> superman to/from plank -> alternating leg lifts (very small range) * 3 way LE stretch with hollow noodle,  *  quad <> hip flexor stretch with foot on hollow noodle  * TrA set with noodle-> yellow hand float pull downs to thighs in wide and staggered stance * cycling on noodle in 85ft lap  lane   12/19/23 Nustep L5 x 5 min Supine hamstring stretch with strap 2x30" knee straight and then knee bent R&L Supine ITB stretch with strap 2x30" R&L Supine piriformis stretch 2x 30" R&L Supine PPT x10 Supine PPT into bridge 2x10 Prone quad stretch with strap x30" R&L Prone hip ext with knee flexed 2x10 (PT assist on R) Staggered stance modified deadlift x10 Gastroc stretch standing x30" Standing fire hydrant red TB 2x10   12/11/23 Therapeutic exercise: Nustep L5 x 5 min Supine KTOS piriformis stretch x 30" bil Supine SKTC stretch x 30" bil S/L clamshell x 10 bil S/L hip abduction x 10 bil Seated KTOS and figure 4 stretch x 30" bil  NMR: Standing shoulder extension RTB x 10  Pallof press RTB x 10 bil  Manual Therapy: STM to bil glutes, R gastroc  PATIENT EDUCATION:  Education details: finalizing aquatic HEP - issued new laminated copy Person educated: Patient Education method: Programmer, multimedia, Demonstration Education comprehension: verbalized understanding, returned demonstration, and needs further education  HOME EXERCISE PROGRAM: Access Code: [email or web address] URL: [email or web address] Date: 12/11/2023 Prepared by: Dossie Swor  Exercises - Seated Piriformis Stretch  - 1 x daily - 7 x weekly - 1 sets - 1 min hold - Seated Piriformis Stretch  - 1 x daily - 7 x weekly - 1 sets - 1 min hold - Standing Glute Med Mobilization with Small Ball on Wall  - 1 x daily - 7 x weekly - 1 sets - 1 min hold - Seated Sciatic Tensioner  - 1 x daily - 7 x weekly - 1 sets - 10 reps - Supine ITB Stretch with Strap  - 1 x daily - 7 x weekly - 2 sets - 30 sec hold - Clamshell with Resistance  - 1 x daily - 7 x weekly - 2 sets - 10 reps - Sidelying Hip Abduction with Resistance at Thighs  - 1 x daily - 7 x weekly - 2 sets - 10 reps - Sidelying Hip Abduction and Extension with Loop Band  - 1 x daily - 7 x weekly - 2 sets - 10 reps - Standing Anti-Rotation Press with Anchored  Resistance  - 1 x daily - 7 x weekly - 2 sets - 10 reps - Supine Hamstring Stretch with Strap  - 1 x daily - 7 x weekly - 2 sets - 2 reps - 30 sec hold  Aquatic Access Code: [email or web address] URL: [email or web address] Date: 12/21/2023 ASSESSMENT:  CLINICAL IMPRESSION: Pt has been discharged from aquatic PT. Presents with very tight quads and hips. Strength has improved but still showing more weakness with glute med/min and max. Advanced strengthening for gluteals with weight machines with good response from patient.     From eval: Patient is a 68 y.o. M who was seen today for physical therapy evaluation and treatment for R LE radiating pain. Assessment significant for bilat hip muscle tightness (R tighter than L) especially in glutes and piriformis, LE posterior chain weakness (R weaker than L), and high sciatic nerve tension in R LE. S/s appear most consistent to sciatica due to hip muscle tightness and weakness affecting pt's ability to tolerate prolonged sitting and standing. Pt will greatly benefit from PT to improve on these deficits. Performed dry needling today to reduce pt's R  posterior hip tension. Plans for aquatics.   OBJECTIVE IMPAIRMENTS: decreased activity tolerance, decreased balance, decreased coordination, decreased endurance, decreased mobility, difficulty walking, decreased ROM, decreased strength, hypomobility, increased fascial restrictions, increased muscle spasms, impaired flexibility, improper body mechanics, postural dysfunction, and pain.   ACTIVITY LIMITATIONS: sitting, standing, and squatting  PARTICIPATION LIMITATIONS: driving and community activity  PERSONAL FACTORS: Age, Fitness, Past/current experiences, and Time since onset of injury/illness/exacerbation are also affecting patient's functional outcome.   REHAB POTENTIAL: Good  CLINICAL DECISION MAKING: Evolving/moderate complexity  EVALUATION COMPLEXITY: Moderate   GOALS: Goals reviewed with  patient? Yes  SHORT TERM GOALS: Target date: 12/12/2023  Pt will be ind with initial HEP Baseline: Goal status: MET- 12/11/23   2.  Pt will have no radiating symptoms when performing SLR test to demo reduced neural tension Baseline:  Goal status: MET- 12/11/23    LONG TERM GOALS: Target date: 01/02/2024   Pt will be ind with management and progression of land and aquatics HEP Baseline:  Goal status: INITIAL  2.  Pt will have improved bilat LE strength to at least 4+/5 for increased standing tolerance and stability Baseline:  Goal status: 12/26/23- PROGRESSING  3.  Pt will be able to tolerate standing >/=15 min without radiating symptoms Baseline:  Goal status: INITIAL  4.  Pt will have improved LEFS to >/=58/80 to demo MCID Baseline:  Goal status: INITIAL   PLAN:  PT FREQUENCY: 2x/week  PT DURATION: 6 weeks  PLANNED INTERVENTIONS: 97164- PT Re-evaluation, 97110-Therapeutic exercises, 97530- Therapeutic activity, 97112- Neuromuscular re-education, 97535- Self Care, 54098- Manual therapy, [phone]- Gait training, [phone]- Aquatic Therapy, [phone]- Electrical stimulation (unattended), [phone]- Ionotophoresis 4mg /ml Dexamethasone , Patient/Family education, Balance training, Stair training, Taping, Dry Needling, Joint mobilization, Spinal mobilization, Cryotherapy, and Moist heat  PLAN FOR NEXT SESSION:reassess; pt wants to try a little more PT; Initiate glute and hamstring strengthening. Continue to stretch glutes/piriformis/hamstring and sciatic nerve. Needling/manual work   Samuella Crocker, PTA  12/26/23 9:30 AM

## 2023-12-28 ENCOUNTER — Ambulatory Visit (HOSPITAL_BASED_OUTPATIENT_CLINIC_OR_DEPARTMENT_OTHER): Admitting: Physical Therapy

## 2024-01-02 ENCOUNTER — Ambulatory Visit: Admitting: Physical Therapy

## 2024-01-02 DIAGNOSIS — M6281 Muscle weakness (generalized): Secondary | ICD-10-CM

## 2024-01-02 DIAGNOSIS — M533 Sacrococcygeal disorders, not elsewhere classified: Secondary | ICD-10-CM

## 2024-01-02 DIAGNOSIS — R262 Difficulty in walking, not elsewhere classified: Secondary | ICD-10-CM

## 2024-01-02 DIAGNOSIS — M5417 Radiculopathy, lumbosacral region: Secondary | ICD-10-CM

## 2024-01-02 DIAGNOSIS — M5431 Sciatica, right side: Secondary | ICD-10-CM

## 2024-01-02 NOTE — Therapy (Addendum)
 OUTPATIENT PHYSICAL THERAPY TREATMENT AND DISCHARGE  PHYSICAL THERAPY DISCHARGE SUMMARY  Visits from Start of Care: 9  Current functional level related to goals / functional outcomes: See below. Pt's last visit with Dr. Vaughn Georges on 01/08/24 indicated pt is to have another surgery.    Remaining deficits: See below   Education / Equipment: See below   Patient agrees to discharge. Patient goals were partially met. Patient is being discharged due to pt having another surgery.   Patient Name: Micheal WHITWELL MRN: [id number] DOB:[birth year], 68 y.o., male Today's Date: 01/02/2024  END OF SESSION:  PT End of Session - 01/02/24 0847     Visit Number 9    Number of Visits 12    Date for PT Re-Evaluation 01/02/24    Authorization Type BCBS    PT Start Time 0847    PT Stop Time 0925    PT Time Calculation (min) 38 min    Activity Tolerance Patient tolerated treatment well    Behavior During Therapy [facility] for tasks assessed/performed              Past Medical History:  Diagnosis Date   Anemia    Arthritis    oa   Bruit of right carotid artery    Cataract    right eye   Elevated cholesterol    GERD (gastroesophageal reflux disease)    Hypertension    Plantar fascial fibromatosis    Past Surgical History:  Procedure Laterality Date   colonscopy  age 23   EYE SURGERY Left 04/10/2016   cataract surgery   EYE SURGERY Bilateral 2015   stents   knee scope and cartlidge implant Left 2009   TOTAL KNEE ARTHROPLASTY Right 05/23/2016   Procedure: RIGHT TOTAL KNEE ARTHROPLASTY;  Surgeon: Claiborne Crew, MD;  Location: [facility];  Service: Orthopedics;  Laterality: Right;  Adductor block   TOTAL KNEE REVISION Right 10/19/2022   Procedure: TOTAL KNEE REVISION;  Surgeon: Claiborne Crew, MD;  Location: [facility];  Service: Orthopedics;  Laterality: Right;   Patient Active Problem List   Diagnosis Date Noted   S/P revision of total knee, 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 Some posterior hip pain.   PERTINENT HISTORY: TKA May 2024, L SI joint fusion January 2025  PAIN:  Are you having pain? Yes: NPRS scale: 5/10 Pain location: posterior R hip Pain description: dull and sharp Aggravating factors: Sitting and standing (~5-10 min) Relieving factors: Laying flat  PRECAUTIONS: None  RED FLAGS: None   WEIGHT BEARING RESTRICTIONS: No  FALLS:  Has patient fallen in last 6 months? No  LIVING ENVIRONMENT: Lives with: lives with their spouse Lives in: House/apartment Stairs: Yes: External: 2 steps; none Has following equipment at home: None  OCCUPATION: Retired -- Quarry manager, wants to travel  PLOF: Independent  PATIENT GOALS: get back to traveling  NEXT MD VISIT: n/a  OBJECTIVE:  Note: Objective measures were completed at Evaluation unless otherwise noted.  DIAGNOSTIC FINDINGS: nothing new on Epic  PATIENT SURVEYS:  LEFS  49/80  24 / 80 = 30.0 %   SENSATION: WFL  MUSCLE LENGTH: Hamstrings: Right tighter than left and reproduced his nerve pain  POSTURE: increased lumbar lordosis  PALPATION: TTP L glute and piriformis  LUMBAR ROM:   Active  A/PROM  eval  Flexion 90%  Extension 100%  Right lateral flexion 50%  Left lateral flexion 90%  Right rotation 90%  Left rotation 100%   (Blank rows = not tested)   LOWER EXTREMITY ROM:  Active ROM Right eval Left eval  Hip flexion    Hip extension    Hip abduction Limited compared to L   Hip  adduction    Hip internal rotation    Hip external rotation    Knee flexion    Knee extension ~5-10 away from full ext   Ankle dorsiflexion    Ankle plantarflexion    Ankle inversion    Ankle eversion     (Blank rows = not tested)  LOWER EXTREMITY MMT:  MMT Right eval Left eval R 12/26/23 L 12/26/23  Hip flexion 5 5    Hip extension 3- 3+ 4- 4  Hip abduction 3- 3+ 4 4+  Hip adduction      Hip internal rotation      Hip external rotation      Knee flexion 3+ 3+ 5 5  Knee extension 5 5    Ankle dorsiflexion      Ankle plantarflexion      Ankle inversion      Ankle eversion       (Blank rows = not tested)  LOWER EXTREMITY SPECIAL TESTS:  Hip special tests: Portia Brittle (FABER) test: negative and Thomas test: negative SLR test: (+) on R  FUNCTIONAL TESTS:  5 times sit to stand: TBA Double leg lowering test: TBA  GAIT: Distance walked: Into clinic Assistive device utilized: None Level of assistance: Complete Independence Comments: Normal reciprocal pattern                                                                                                                                TREATMENT DATE:  01/02/24 Sitting figure 4 stretch x 30 Sitting piriformis stretch x 30 Hamstring stretch x 30 Hip flexor stretch x 30 Standing cable hip abd 10# x10 bilat LE Standing cable hip ext 10# x10 bilat LE 3 way hip yellow TB, foot on wash rag focusing on glute activation 2x10 Modified single leg T (small range) 2x10 SLS 3x10 but pt unable to maintain for >5 sec bilat Rechecked goals  12/26/23 Nustep L5x57min UE/LE Supine quad stretch with strap 2x30 B Strength reassessed High straight leg raises x 10 BLE Leg press 25lb 2x10; 35lb x 10 BLE Standing cable hip ext and abduction 5lb x 10 BLE Leg curls 45lb 3x10 BLE  12/21/23 Pt seen for aquatic therapy today.  Treatment took place in water  3.5-4.75 ft in depth at the [facility] pool. Temp of  water  was 91.  Pt  entered/exited the pool via stairs independently with bilat rail.   On deck- IASTM to R ant thigh to decrease fascial restrictions and improve mobility  * suitcase carry with single blue hand float - walking forward/ backward  * calf stretch -> heels off bottom ladder hole x 20s x 2;  BKTC stretch with feet in bottom ladder hole with/without wag the tail * UE on blue hand floats:  single leg clam 2 x 10 each LE * UE on blue hand floats:  3 way LE kick ,2 x 10, cues to only touch toe to back * side stepping with arm addct with blue hand floats x 3 laps * fig 4 stretch at wall x 20s x 2  * plank on blue hand floats -> superman to/from plank -> alternating leg lifts (very small range) * 3 way LE stretch with hollow noodle,  * quad <> hip flexor stretch with foot on hollow noodle  * TrA set with noodle-> yellow hand float pull downs to thighs in wide and staggered stance * cycling on noodle in 53ft lap lane   12/19/23 Nustep L5 x 5 min Supine hamstring stretch with strap 2x30 knee straight and then knee bent R&L Supine ITB stretch with strap 2x30 R&L Supine piriformis stretch 2x 30 R&L Supine PPT x10 Supine PPT into bridge 2x10 Prone quad stretch with strap x30 R&L Prone hip ext with knee flexed 2x10 (PT assist on R) Staggered stance modified deadlift x10 Gastroc stretch standing x30 Standing fire hydrant red TB 2x10   12/11/23 Therapeutic exercise: Nustep L5 x 5 min Supine KTOS piriformis stretch x 30 bil Supine SKTC stretch x 30 bil S/L clamshell x 10 bil S/L hip abduction x 10 bil Seated KTOS and figure 4 stretch x 30 bil  NMR: Standing shoulder extension RTB x 10  Pallof press RTB x 10 bil  Manual Therapy: STM to bil glutes, R gastroc  PATIENT EDUCATION:  Education details: finalizing aquatic HEP - issued new laminated copy Person educated: Patient Education method: Programmer, multimedia, Demonstration Education comprehension: verbalized understanding, returned  demonstration, and needs further education  HOME EXERCISE PROGRAM: Access Code: [email or web address] URL: [email or web address] Date: 01/02/2024 Prepared by: Dezyrae Kensinger April Erman [place]  Exercises - Seated Piriformis Stretch  - 1 x daily - 7 x weekly - 1 sets - 1 min hold - Seated Piriformis Stretch  - 1 x daily - 7 x weekly - 1 sets - 1 min hold - Standing Glute Med Mobilization with Small Ball on Wall  - 1 x daily - 7 x weekly - 1 sets - 1 min hold - Seated Sciatic Tensioner  - 1 x daily - 7 x weekly - 1 sets - 10 reps - Supine ITB Stretch with Strap  - 1 x daily - 7 x weekly - 2 sets - 30 sec hold - Clamshell with Resistance  - 1 x daily - 7 x weekly - 2 sets - 10 reps - Sidelying Hip Abduction and Extension with Loop Band  - 1 x daily - 7 x weekly - 2 sets - 10 reps - Standing Anti-Rotation Press with Anchored Resistance  - 1 x daily - 7 x weekly - 2 sets - 10 reps - Supine Hamstring Stretch with Strap  - 1 x daily - 7 x weekly - 2 sets - 2 reps - 30 sec hold - Supine Bridge with Resistance Band  - 1 x daily - 7 x weekly -  2 sets - 10 reps - 5 second  hold - Prone Hip Extension with Bent Knee  - 1 x daily - 7 x weekly - 2 sets - 10 reps - Standing Hip Abduction with Bent Knee  - 1 x daily - 7 x weekly - 2 sets - 10 reps - Single Leg 3 Way Reach with Support  - 1 x daily - 7 x weekly - 2 sets - 10 reps   Aquatic Access Code: [email or web address] URL: [email or web address] Date: 12/21/2023 ASSESSMENT:  CLINICAL IMPRESSION: Continued stretching and strengthening hips. Working on increasing more stability and endurance for increased standing. Pt with a decrease in his LEFS score -- not sure if pt interpreted questions the same as initial eval as he reports a subjective improvement of his overall symptoms. Able to stand longer periods notable when he is at church. Discussed continuing POC for another 4-6 weeks to continue his gains.    From eval: Patient is a 68 y.o. M who was seen  today for physical therapy evaluation and treatment for R LE radiating pain. Assessment significant for bilat hip muscle tightness (R tighter than L) especially in glutes and piriformis, LE posterior chain weakness (R weaker than L), and high sciatic nerve tension in R LE. S/s appear most consistent to sciatica due to hip muscle tightness and weakness affecting pt's ability to tolerate prolonged sitting and standing. Pt will greatly benefit from PT to improve on these deficits. Performed dry needling today to reduce pt's R posterior hip tension. Plans for aquatics.   OBJECTIVE IMPAIRMENTS: decreased activity tolerance, decreased balance, decreased coordination, decreased endurance, decreased mobility, difficulty walking, decreased ROM, decreased strength, hypomobility, increased fascial restrictions, increased muscle spasms, impaired flexibility, improper body mechanics, postural dysfunction, and pain.   ACTIVITY LIMITATIONS: sitting, standing, and squatting  PARTICIPATION LIMITATIONS: driving and community activity  PERSONAL FACTORS: Age, Fitness, Past/current experiences, and Time since onset of injury/illness/exacerbation are also affecting patient's functional outcome.   REHAB POTENTIAL: Good  CLINICAL DECISION MAKING: Evolving/moderate complexity  EVALUATION COMPLEXITY: Moderate   GOALS: Goals reviewed with patient? Yes  SHORT TERM GOALS: Target date: 12/12/2023  Pt will be ind with initial HEP Baseline: Goal status: MET- 12/11/23   2.  Pt will have no radiating symptoms when performing SLR test to demo reduced neural tension Baseline:  Goal status: MET- 12/11/23    LONG TERM GOALS: NEW Target date: 02/13/2024    Pt will be ind with management and progression of land and aquatics HEP Baseline:  01/02/24: Ind with aquatics, still progressing land Goal status: IN PROGRESS  2.  Pt will have improved bilat LE strength to at least 4+/5 for increased standing tolerance and  stability Baseline:  01/02/24: See MMT above Goal status: PROGRESSING  3.  Pt will be able to tolerate standing >/=15 min without radiating symptoms Baseline:  01/02/24: 10-15 min Goal status: IN PROGRESS  4.  Pt will have improved LEFS to >/=58/80 to demo MCID Baseline:  01/02/24: Lower Extremity Functional Score: 24 / 80 = 30.0 % Goal status: IN PROGRESS   PLAN:  PT FREQUENCY: 1-2x/week  PT DURATION: 6 weeks  PLANNED INTERVENTIONS: 97164- PT Re-evaluation, 97110-Therapeutic exercises, 97530- Therapeutic activity, 97112- Neuromuscular re-education, 97535- Self Care, 78295- Manual therapy, [phone]- Gait training, [phone]- Aquatic Therapy, [phone]- Electrical stimulation (unattended), [phone]- Ionotophoresis 4mg /ml Dexamethasone , Patient/Family education, Balance training, Stair training, Taping, Dry Needling, Joint mobilization, Spinal mobilization, Cryotherapy, and Moist heat  PLAN FOR NEXT SESSION:reassess; pt wants to try a little  more PT; Initiate glute and hamstring strengthening. Continue to stretch glutes/piriformis/hamstring and sciatic nerve. Needling/manual work   Makendra Vigeant April Larri Ply Donney Caraveo, [place]  01/02/24 8:47 AM

## 2024-01-15 ENCOUNTER — Ambulatory Visit

## 2024-01-17 ENCOUNTER — Encounter

## 2024-01-22 ENCOUNTER — Encounter

## 2024-01-24 ENCOUNTER — Other Ambulatory Visit: Payer: Self-pay | Admitting: Orthopedic Surgery

## 2024-01-24 DIAGNOSIS — M533 Sacrococcygeal disorders, not elsewhere classified: Secondary | ICD-10-CM

## 2024-01-25 ENCOUNTER — Ambulatory Visit: Admitting: Physical Therapy

## 2024-01-29 ENCOUNTER — Encounter

## 2024-02-01 ENCOUNTER — Encounter: Admitting: Physical Therapy

## 2024-02-04 ENCOUNTER — Encounter

## 2024-02-06 ENCOUNTER — Encounter: Admitting: Physical Therapy
